# Patient Record
Sex: Female | Born: 1955
Health system: Southern US, Community
[De-identification: ages and names within clinical notes are randomized; demographics above are authoritative.]

## PROBLEM LIST (undated history)

## (undated) DIAGNOSIS — B009 Herpesviral infection, unspecified: Secondary | ICD-10-CM

## (undated) DIAGNOSIS — D649 Anemia, unspecified: Secondary | ICD-10-CM

## (undated) DIAGNOSIS — N852 Hypertrophy of uterus: Secondary | ICD-10-CM

## (undated) DIAGNOSIS — R7309 Other abnormal glucose: Secondary | ICD-10-CM

## (undated) HISTORY — DX: Hypertrophy of uterus: N85.2

## (undated) HISTORY — DX: Herpesviral infection, unspecified: B00.9

## (undated) HISTORY — DX: Anemia, unspecified: D64.9

## (undated) HISTORY — DX: Other abnormal glucose: R73.09

---

## 1978-05-14 HISTORY — PX: TUBAL LIGATION: SHX77

## 1994-05-14 HISTORY — PX: FOOT SURGERY: SHX648

## 1997-11-22 ENCOUNTER — Ambulatory Visit (HOSPITAL_COMMUNITY): Admission: RE | Admit: 1997-11-22 | Discharge: 1997-11-22 | Payer: Self-pay | Admitting: Obstetrics and Gynecology

## 1999-01-04 ENCOUNTER — Other Ambulatory Visit: Admission: RE | Admit: 1999-01-04 | Discharge: 1999-01-04 | Payer: Self-pay | Admitting: *Deleted

## 1999-11-20 ENCOUNTER — Other Ambulatory Visit: Admission: RE | Admit: 1999-11-20 | Discharge: 1999-11-20 | Payer: Self-pay | Admitting: Obstetrics and Gynecology

## 1999-11-20 ENCOUNTER — Encounter (INDEPENDENT_AMBULATORY_CARE_PROVIDER_SITE_OTHER): Payer: Self-pay

## 2000-01-09 ENCOUNTER — Emergency Department (HOSPITAL_COMMUNITY): Admission: EM | Admit: 2000-01-09 | Discharge: 2000-01-09 | Payer: Self-pay | Admitting: Emergency Medicine

## 2000-01-13 HISTORY — PX: VAGINAL HYSTERECTOMY: SUR661

## 2000-01-23 ENCOUNTER — Inpatient Hospital Stay (HOSPITAL_COMMUNITY): Admission: RE | Admit: 2000-01-23 | Discharge: 2000-01-24 | Payer: Self-pay | Admitting: Obstetrics and Gynecology

## 2000-01-23 ENCOUNTER — Encounter (INDEPENDENT_AMBULATORY_CARE_PROVIDER_SITE_OTHER): Payer: Self-pay

## 2001-04-25 ENCOUNTER — Other Ambulatory Visit: Admission: RE | Admit: 2001-04-25 | Discharge: 2001-04-25 | Payer: Self-pay | Admitting: Obstetrics and Gynecology

## 2001-05-09 ENCOUNTER — Ambulatory Visit (HOSPITAL_COMMUNITY): Admission: RE | Admit: 2001-05-09 | Discharge: 2001-05-09 | Payer: Self-pay | Admitting: Obstetrics and Gynecology

## 2001-05-09 ENCOUNTER — Encounter: Payer: Self-pay | Admitting: Obstetrics and Gynecology

## 2003-08-24 ENCOUNTER — Other Ambulatory Visit: Admission: RE | Admit: 2003-08-24 | Discharge: 2003-08-24 | Payer: Self-pay | Admitting: Obstetrics and Gynecology

## 2003-08-24 LAB — HM PAP SMEAR

## 2003-08-27 ENCOUNTER — Ambulatory Visit (HOSPITAL_COMMUNITY): Admission: RE | Admit: 2003-08-27 | Discharge: 2003-08-27 | Payer: Self-pay | Admitting: Obstetrics and Gynecology

## 2004-10-31 ENCOUNTER — Ambulatory Visit: Payer: Self-pay | Admitting: Internal Medicine

## 2004-11-20 ENCOUNTER — Encounter (INDEPENDENT_AMBULATORY_CARE_PROVIDER_SITE_OTHER): Payer: Self-pay | Admitting: Specialist

## 2004-11-20 ENCOUNTER — Ambulatory Visit: Payer: Self-pay | Admitting: Internal Medicine

## 2004-11-21 ENCOUNTER — Ambulatory Visit (HOSPITAL_COMMUNITY): Admission: RE | Admit: 2004-11-21 | Discharge: 2004-11-21 | Payer: Self-pay | Admitting: Obstetrics and Gynecology

## 2004-12-14 ENCOUNTER — Encounter: Admission: RE | Admit: 2004-12-14 | Discharge: 2004-12-14 | Payer: Self-pay | Admitting: Obstetrics and Gynecology

## 2005-11-27 ENCOUNTER — Ambulatory Visit (HOSPITAL_COMMUNITY): Admission: RE | Admit: 2005-11-27 | Discharge: 2005-11-27 | Payer: Self-pay | Admitting: Obstetrics and Gynecology

## 2006-12-03 ENCOUNTER — Ambulatory Visit (HOSPITAL_COMMUNITY): Admission: RE | Admit: 2006-12-03 | Discharge: 2006-12-03 | Payer: Self-pay | Admitting: Obstetrics and Gynecology

## 2007-12-10 ENCOUNTER — Ambulatory Visit (HOSPITAL_COMMUNITY): Admission: RE | Admit: 2007-12-10 | Discharge: 2007-12-10 | Payer: Self-pay | Admitting: Obstetrics and Gynecology

## 2008-09-13 ENCOUNTER — Encounter: Payer: Self-pay | Admitting: Obstetrics and Gynecology

## 2008-09-13 ENCOUNTER — Ambulatory Visit (HOSPITAL_COMMUNITY): Admission: RE | Admit: 2008-09-13 | Discharge: 2008-09-13 | Payer: Self-pay | Admitting: Obstetrics and Gynecology

## 2008-09-13 HISTORY — PX: PELVIC LAPAROSCOPY: SHX162

## 2008-09-13 HISTORY — PX: OTHER SURGICAL HISTORY: SHX169

## 2008-10-21 ENCOUNTER — Ambulatory Visit: Admission: RE | Admit: 2008-10-21 | Discharge: 2008-10-21 | Payer: Self-pay | Admitting: Gynecologic Oncology

## 2008-11-18 ENCOUNTER — Ambulatory Visit: Admission: RE | Admit: 2008-11-18 | Discharge: 2008-11-18 | Payer: Self-pay | Admitting: Gynecologic Oncology

## 2008-12-14 ENCOUNTER — Ambulatory Visit (HOSPITAL_COMMUNITY): Admission: RE | Admit: 2008-12-14 | Discharge: 2008-12-14 | Payer: Self-pay | Admitting: Obstetrics and Gynecology

## 2009-10-25 ENCOUNTER — Encounter (INDEPENDENT_AMBULATORY_CARE_PROVIDER_SITE_OTHER): Payer: Self-pay | Admitting: *Deleted

## 2009-12-15 ENCOUNTER — Ambulatory Visit (HOSPITAL_COMMUNITY): Admission: RE | Admit: 2009-12-15 | Discharge: 2009-12-15 | Payer: Self-pay | Admitting: Obstetrics and Gynecology

## 2010-01-18 ENCOUNTER — Telehealth: Payer: Self-pay | Admitting: Internal Medicine

## 2010-01-19 ENCOUNTER — Encounter: Payer: Self-pay | Admitting: Internal Medicine

## 2010-02-23 ENCOUNTER — Encounter (INDEPENDENT_AMBULATORY_CARE_PROVIDER_SITE_OTHER): Payer: Self-pay | Admitting: *Deleted

## 2010-02-27 ENCOUNTER — Ambulatory Visit: Payer: Self-pay | Admitting: Internal Medicine

## 2010-03-13 ENCOUNTER — Ambulatory Visit: Payer: Self-pay | Admitting: Internal Medicine

## 2010-03-14 LAB — HM COLONOSCOPY

## 2010-04-07 ENCOUNTER — Encounter: Payer: Self-pay | Admitting: Internal Medicine

## 2010-05-11 ENCOUNTER — Ambulatory Visit
Admission: RE | Admit: 2010-05-11 | Discharge: 2010-05-11 | Payer: Self-pay | Source: Home / Self Care | Attending: Surgery | Admitting: Surgery

## 2010-05-25 ENCOUNTER — Encounter: Payer: Self-pay | Admitting: Internal Medicine

## 2010-06-15 NOTE — Progress Notes (Signed)
Summary: ? re recall col  Phone Note Call from Patient Call back at Home Phone [phone]   Caller: Patient Call For: Dr Juanda Chance Reason for Call: Talk to Nurse Summary of Call: Patient was due for recall col in 11-2009 but Dr Juanda Chance changed it to 11-2011. Patient is wondering if she should go ahead and schedule colon since she had her fallopian tubes removed for cancerous spts on them. Initial call taken by: Tawni Levy,  January 18, 2010 12:01 PM  Follow-up for Phone Call        Patient called back and reported that her brother was diagnosed at 83 with colon CA and last year she had her ovaries and fallopian tubes removed for cancer.  Do Brodie her recall was changed.  I will order her paper chart for your review Follow-up by: Darcey Nora RN, CGRN,  January 18, 2010 12:55 PM  Additional Follow-up for Phone Call Additional follow up Details #1::        Dr Juanda Chance I have placed her paper chart in your office Darcey Nora RN, [facility]  January 19, 2010 9:58 AM    Additional Follow-up for Phone Call Additional follow up Details #2::    chart reviewed, she is right. I forgot that her brothe had colon cancer. So, please, schedule colon at her convenience, ( direct) Follow-up by: Hart Carwin MD,  January 19, 2010 12:48 PM  Additional Follow-up for Phone Call Additional follow up Details #3:: Details for Additional Follow-up Action Taken: Patient aware.  Colon scheduled for 03/10/10 11:00.  pre-visit scheduled for 02/27/10 11:00   Additional Follow-up by: Darcey Nora RN, CGRN,  January 19, 2010 1:36 PM

## 2010-06-15 NOTE — Letter (Signed)
Summary: Colonoscopy Date Change Letter  [facility]  [address] [place], [place] [address]   Phone: [phone]  Fax: [phone]      October 25, 2009 MRN: [id number]   [facility] [address] [place], [place]  [address]   Dear [name] [name],   Previously you were recommended to have a repeat colonoscopy around this time. Your chart was recently reviewed by Dr. Hedwig Morton. Juanda Chance of [facility]. Follow up colonoscopy is now recommended in July 2013. This revised recommendation is based on current, nationally recognized guidelines for colorectal cancer screening and polyp surveillance. These guidelines are endorsed by the American Cancer Society, [employer] on Colorectal Cancer as well as numerous other major medical organizations.  Please understand that our recommendation assumes that you do not have any new symptoms such as bleeding, a change in bowel habits, anemia, or significant abdominal discomfort. If you do have any concerning GI symptoms or want to discuss the guideline recommendations, please call to arrange an office visit at your earliest convenience. Otherwise we will keep you in our reminder system and contact you 1-2 months prior to the date listed above to schedule your next colonoscopy.  Thank you,  Hedwig Morton. Juanda Chance, M.D.  [facility] Gastroenterology Division [phone]

## 2010-06-15 NOTE — Letter (Signed)
Summary: Pre Visit Letter Revised  [facility]  [address] [place], [place] [address]   Phone: [phone]  Fax: [phone]        01/19/2010 MRN: [id number] [facility] [address] [place], [place]  [address]             Procedure Date:  03/10/10  Welcome to the Gastroenterology Division at [facility].    You are scheduled to see a nurse for your pre-procedure visit on 02/27/10 at 11:00 on the 3rd floor at [facility], [address]  We ask that you try to arrive at our office 15 minutes prior to your appointment time to allow for check-in.  Please take a minute to review the attached form.  If you answer "Yes" to one or more of the questions on the first page, we ask that you call the person listed at your earliest opportunity.  If you answer "No" to all of the questions, please complete the rest of the form and bring it to your appointment.    Your nurse visit will consist of discussing your medical and surgical history, your immediate family medical history, and your medications.   If you are unable to list all of your medications on the form, please bring the medication bottles to your appointment and we will list them.  We will need to be aware of both prescribed and over the counter drugs.  We will need to know exact dosage information as well.    Please be prepared to read and sign documents such as consent forms, a financial agreement, and acknowledgement forms.  If necessary, and with your consent, a friend or relative is welcome to sit-in on the nurse visit with you.  Please bring your insurance card so that we may make a copy of it.  If your insurance requires a referral to see a specialist, please bring your referral form from your primary care physician.  No co-pay is required for this nurse visit.     If you cannot keep your appointment, please call [phone] to cancel or reschedule prior to your appointment date.  This allows  [place] the opportunity to schedule an appointment for another patient in need of care.    Thank you for choosing [facility] for your medical needs.  We appreciate the opportunity to care for you.  Please visit [place] at our website  to learn more about our practice.  Sincerely, The Gastroenterology Division

## 2010-06-15 NOTE — Letter (Signed)
Summary: [facility] Instructions  [facility]  [address] [place], [place] [address]   Phone: [phone]  Fax: [phone]       [facility]    [birth year]    MRN: [id number]       Procedure Day /Date:  Monday 03/13/2010     Arrival Time: 8:00 am     Procedure Time: 9:00 am     Location of Procedure:                    _x _  [facility] (4th Floor)    PREPARATION FOR COLONOSCOPY WITH MIRALAX  Starting 5 days prior to your procedure Wednesday 10/26 do not eat nuts, seeds, popcorn, corn, beans, peas,  salads, or any raw vegetables.  Do not take any fiber supplements (e.g. Metamucil, Citrucel, and Benefiber). ____________________________________________________________________________________________________   THE DAY BEFORE YOUR PROCEDURE         DATE: Sunday 10/30  1   Drink clear liquids the entire day-NO SOLID FOOD  2   Do not drink anything colored red or purple.  Avoid juices with pulp.  No orange juice.  3   Drink at least 64 oz. (8 glasses) of fluid/clear liquids during the day to prevent dehydration and help the prep work efficiently.  CLEAR LIQUIDS INCLUDE: Water Jello Ice Popsicles Tea (sugar ok, no milk/cream) Powdered fruit flavored drinks Coffee (sugar ok, no milk/cream) Gatorade Juice: apple, white grape, white cranberry  Lemonade Clear bullion, consomm, broth Carbonated beverages (any kind) Strained chicken noodle soup Hard Candy  4   Mix the entire bottle of Miralax with 64 oz. of Gatorade/Powerade in the morning and put in the refrigerator to chill.  5   At 3:00 pm take 2 Dulcolax/Bisacodyl tablets.  6   At 4:30 pm take one Reglan/Metoclopramide tablet.  7  Starting at 5:00 pm drink one 8 oz glass of the Miralax mixture every 15-20 minutes until you have finished drinking the entire 64 oz.  You should finish drinking prep around 7:30 or 8:00 pm.  8   If you are nauseated, you may take the 2nd Reglan/Metoclopramide  tablet at 6:30 pm.        9    At 8:00 pm take 2 more DULCOLAX/Bisacodyl tablets.     THE DAY OF YOUR PROCEDURE      DATE:  Monday 10/31  You may drink clear liquids until 7:00 am  (2 HOURS BEFORE PROCEDURE).   MEDICATION INSTRUCTIONS  Unless otherwise instructed, you should take regular prescription medications with a small sip of water as early as possible the morning of your procedure.          OTHER INSTRUCTIONS  You will need a responsible adult at least 56 years of age to accompany you and drive you home.   This person must remain in the waiting room during your procedure.  Wear loose fitting clothing that is easily removed.  Leave jewelry and other valuables at home.  However, you may wish to bring a book to read or an iPod/MP3 player to listen to music as you wait for your procedure to start.  Remove all body piercing jewelry and leave at home.  Total time from sign-in until discharge is approximately 2-3 hours.  You should go home directly after your procedure and rest.  You can resume normal activities the day after your procedure.  The day of your procedure you should not:   Drive  Make legal decisions   Operate machinery   Drink alcohol   Return to work  You will receive specific instructions about eating, activities and medications before you leave.   The above instructions have been reviewed and explained to me by   Ezra Sites RN  February 27, 2010 11:04 AM    I fully understand and can verbalize these instructions _____________________________ Date _______

## 2010-06-15 NOTE — Miscellaneous (Signed)
Summary: [facility]  Clinical Lists Changes  Medications: Added new medication of MIRALAX   POWD (POLYETHYLENE GLYCOL 3350) As per prep  instructions. - Signed Added new medication of DULCOLAX 5 MG  TBEC (BISACODYL) Day before procedure take 2 at 3pm and 2 at 8pm. - Signed Added new medication of REGLAN 10 MG  TABS (METOCLOPRAMIDE HCL) As per prep instructions. - Signed Rx of MIRALAX   POWD (POLYETHYLENE GLYCOL 3350) As per prep  instructions.;  #255gm x 0;  Signed;  Entered by: Ezra Sites RN;  Authorized by: Hart Carwin MD;  Method used: [facility] [phone]*, [address] [place], [place]  [address], Ph: [phone] or [phone], Fax: [phone] Rx of DULCOLAX 5 MG  TBEC (BISACODYL) Day before procedure take 2 at 3pm and 2 at 8pm.;  #4 x 0;  Signed;  Entered by: Ezra Sites RN;  Authorized by: Hart Carwin MD;  Method used: [facility] to [facility] [phone]*, [address] [place], [place]  [address], Ph: [phone] or [phone], Fax: [phone] Rx of REGLAN 10 MG  TABS (METOCLOPRAMIDE HCL) As per prep instructions.;  #2 x 0;  Signed;  Entered by: Ezra Sites RN;  Authorized by: Hart Carwin MD;  Method used: [facility] to [facility] [phone]*, [address] [place], [place]  [address], Ph: [phone] or [phone], Fax: [phone] Allergies: Added new allergy or adverse reaction of CODEINE Observations: Added new observation of NKA: F (02/27/2010 10:38)    Prescriptions: REGLAN 10 MG  TABS (METOCLOPRAMIDE HCL) As per prep instructions.  #2 x 0   Entered by:   Ezra Sites RN   Authorized by:   Hart Carwin MD   Signed by:   Ezra Sites RN on 02/27/2010   Method used:   [facility]ronically to        [facility] [phone]* (retail)       [address]       [place], [place]  [address]       Ph: [phone] or [phone]       Fax: [phone]   RxID:   [id number] DULCOLAX 5 MG  TBEC (BISACODYL) Day before procedure take 2 at 3pm and 2 at 8pm.  #4 x 0  Entered by:   Ezra Sites RN   Authorized by:   Hart Carwin MD   Signed by:   Ezra Sites RN on 02/27/2010   Method used:   Electronically to        [facility] [phone]* (retail)       [address]       [place], [place]  [address]       Ph: [phone] or [phone]       Fax: [phone]   RxID:   [id number] MIRALAX   POWD (POLYETHYLENE GLYCOL 3350) As per prep  instructions.  #255gm x 0   Entered by:   Ezra Sites RN   Authorized by:   Hart Carwin MD   Signed by:   Ezra Sites RN on 02/27/2010   Method used:   Electronically to        [facility] [phone]* (retail)       [address]       [place], [place]  [address]       Ph: [phone] or [phone]       Fax: [phone]   RxID:   [id number]

## 2010-06-15 NOTE — Procedures (Signed)
Summary: Colonoscopy  Patient: [name] Note: All result statuses are Final unless otherwise noted.  Tests: (1) Colonoscopy (COL)   COL Colonoscopy           DONE (C)      [facility]     [address]     [place], [place]  [address]           COLONOSCOPY PROCEDURE REPORT           PATIENT:  [name] [name], [name] [name]  MR#:  [id number]     BIRTHDATE:  January 17, 1956, 54 yrs. old  GENDER:  female     ENDOSCOPIST:  Hedwig Morton. Juanda Chance, MD     REF. BY:  Meredeth Ide, M.D., Dr Maurice Small     PROCEDURE DATE:  03/13/2010     PROCEDURE:  Colonoscopy [address]     ASA CLASS:  Class I     INDICATIONS:  history of hyperplastic polyps     MEDICATIONS:   Versed 5 mg, Fentanyl 50 mcg           DESCRIPTION OF PROCEDURE:   After the risks benefits and     alternatives of the procedure were thoroughly explained, informed     consent was obtained.  Digital rectal exam was performed and     revealed no rectal masses.   The LB [id number]-[id number] endoscope     was introduced through the anus and advanced to the cecum, which     was identified by both the appendix and ileocecal valve, without     limitations.  The quality of the prep was excellent, using     MiraLax.  The instrument was then slowly withdrawn as the colon     was fully examined.     <<PROCEDUREIMAGES>>           FINDINGS:  Internal and external hemorrhoids were found (see     image4, image5, and image6).  This was otherwise a normal     examination of the colon (see image3, image2, and image1).     Retroflexed views in the rectum revealed no abnormalities.    The     scope was then withdrawn from the patient and the procedure     completed.           COMPLICATIONS:  None     ENDOSCOPIC IMPRESSION:     1) Internal and external hemorrhoids     2) Otherwise normal examination     RECOMMENDATIONS:     1) high fiber diet     Analpram 2.5% cream prn, #15 gm, apply bid     REPEAT EXAM:  In 10 year(s) for.        ______________________________     Hedwig Morton. Juanda Chance, MD           CC:           n.     REVISED:  03/13/2010 09:43 AM     eSIGNED:   Hedwig Morton. Brodie at 03/13/2010 09:43 AM           Pabst, Adysen, Raphael [id number]  Note: An exclamation mark (!) indicates a result that was not dispersed into the flowsheet. Document Creation Date: 03/13/2010 9:44 AM _______________________________________________________________________  (1) Order result status: Final Collection or observation date-time: 03/13/2010 09:35 Requested date-time:  Receipt date-time:  Reported date-time:  Referring Physician:   Ordering Physician: Lina Sar [phone]) Specimen Source:  Source: [name] Order Number: [id number] Lab site:   Appended  Document: Colonoscopy    Clinical Lists Changes  Observations: Added new observation of COLONNXTDUE: 02/2020 (03/13/2010 13:37)

## 2010-06-15 NOTE — Letter (Signed)
Summary: [facility]   Imported By: Lester Litchfield 04/25/2010 08:53:44  _____________________________________________________________________  External Attachment:    Type:   Image     Comment:   External Document

## 2010-06-15 NOTE — Miscellaneous (Signed)
Summary: analpram rx.  Clinical Lists Changes  Medications: Added new medication of ANALPRAM-HC 1-2.5 %  CREA (HYDROCORTISONE ACE-PRAMOXINE) analpram 2.5% cream as needed, #15gm, apply two times a day. - Signed Rx of ANALPRAM-HC 1-2.5 %  CREA (HYDROCORTISONE ACE-PRAMOXINE) analpram 2.5% cream as needed, #15gm, apply two times a day.;  #1 x 0;  Signed;  Entered by: Darlyn Read RN;  Authorized by: Hart Carwin MD;  Method used: [facility] to [facility] [phone]*, [address] [place], [place]  [address], Ph: [phone] or [phone], Fax: [phone]    Prescriptions: ANALPRAM-HC 1-2.5 %  CREA (HYDROCORTISONE ACE-PRAMOXINE) analpram 2.5% cream as needed, #15gm, apply two times a day.  #1 x 0   Entered by:   Darlyn Read RN   Authorized by:   Hart Carwin MD   Signed by:   Darlyn Read RN on 03/13/2010   Method used:   Electronically to        [facility] [phone]* (retail)       [address]       [place], [place]  [address]       Ph: [phone] or [phone]       Fax: [phone]   RxID:   [id number]

## 2010-06-21 NOTE — Letter (Signed)
Summary: Valarie Merino MD/[facility]  Valarie Merino MD/[facility]   Imported By: Lester Corunna 06/15/2010 10:59:02  _____________________________________________________________________  External Attachment:    Type:   Image     Comment:   External Document

## 2010-07-24 LAB — POCT HEMOGLOBIN-HEMACUE: Hemoglobin: 14 g/dL (ref 12.0–15.0)

## 2010-08-22 LAB — CBC
MCHC: 33.6 g/dL (ref 30.0–36.0)
WBC: 5.2 10*3/uL (ref 4.0–10.5)

## 2010-09-26 NOTE — Consult Note (Signed)
NAME:  [name] [name]              ACCOUNT NO.:  [id number]   MEDICAL RECORD NO.:  [id number]          PATIENT TYPE:  OUT   LOCATION:                               FACILITY:  [facility]   PHYSICIAN:  Laurette Schimke, MD     DATE OF BIRTH:  1955-06-19   DATE OF CONSULTATION:  DATE OF DISCHARGE:                                 CONSULTATION   REASON FOR VISIT:  Discussion of pathology review.   HISTORY OF PRESENT ILLNESS:  This is a 55 year old gravida 3, para 3,  who underwent hysterectomy for menorrhagia in 2001.  She developed  dyspareunia subsequently, and as such, underwent a laparoscopic BSO on  Sep 13, 2008.  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  A review was obtained from Dr. Serita Butcher at [facility] and the findings were notable for an  area of serous adenocarcinoma in situ.  It was unclear whether or not  this was an area of in situ neoplasia of the tube or endometrioid.  There is also a different focus, which is subtly hinting to atrophic  endometriosis in the ovaries.  These findings were discussed with [name] [name] and in addition, a query was made regarding her family history.  I found that she has a mother with bladder cancer diagnosed at the age  of 59, a sister with leukemia diagnosed at 27, a brother with prostate  cancer at age of 57, and a sister with colon cancer at the age of 69.  There are no known female or female breast cancers, nor is there any  family history of ovarian cancer.  I have informed [name] [name] that it is  my opinion that she can return to just normal surveillance with her  normal general examinations as required for excellent female health  care.  She was happy with this information and a copy of that pathology  summary was provided to her for her medical record.  I have asked [name] [name] to follow up with Dr. Tresa Res.      Laurette Schimke, MD  Electronically Signed     WB/MEDQ  D:  11/18/2008  T:   11/18/2008  Job:  [id number]   cc:   Telford Nab, R.N.  [address]  [place], [place] [address]   Edwena Felty. Romine, M.D.  Fax: [phone]

## 2010-09-26 NOTE — Op Note (Signed)
NAME:  [name] [name], [name] [name]              ACCOUNT NO.:  [id number]   MEDICAL RECORD NO.:  [id number]          PATIENT TYPE:  AMB   LOCATION:  [facility]                           FACILITY:  [facility]   PHYSICIAN:  Cynthia P. Romine, M.D.DATE OF BIRTH:  28-Jan-1956   DATE OF PROCEDURE:  09/13/2008  DATE OF DISCHARGE:                               OPERATIVE REPORT   PREOPERATIVE DIAGNOSIS:  Dyspareunia, status post total vaginal  hysterectomy.   POSTOPERATIVE DIAGNOSIS:  Dyspareunia, status post total vaginal  hysterectomy; endometriosis; and multiple adhesions to the vaginal cuff.   PROCEDURES:  Operative laparoscopy with lysis of adhesions and bilateral  salpingo-oophorectomy.   SURGEON:  Cynthia P. Romine, MD   ASSISTANT:  Lum Keas, MD   ANESTHESIA:  General endotracheal.   ESTIMATED BLOOD LOSS:  Minimal.   COMPLICATIONS:  None.   FINDINGS:  Upon entering the abdomen with the laparoscope, it could be  seen that there was some filmy adhesions between the small bowel and the  vaginal cuff and between the ovary on the patient's right and the  vaginal cuff.  There was an approximately 1-cm cystic-looking structure  just towards the round ligaments in the ovary that appeared to be  endometriosis on the patient's right.  The left ovary was more mobile,  but there were adhesions of the colon to the pelvic sidewall and the  cuff on that side as well.   PROCEDURE:  The patient was taken to the operating room and after  induction of adequate general endotracheal anesthesia, was placed in the  low dorsal lithotomy position and prepped and draped in the usual  fashion.  As the patient was being prepped with Betadine, it was noted  that small bowel was visible on her abdomen that then spread to the  lower part of her breasts and down her thighs.  The possible ascending  agent could be the CDG wash that the patient did preoperatively in the  holding room as per hospital policy to prevent MRSA or  she had been  given 2 g of cefotetan in the holding room as well.  She had no previous  allergic reaction to antibiotics.  She was given 50 mg of Benadryl and  125 mg of Solu-Medrol IV and the wells did decrease although not go  completely absent at the end of the procedure.  After prepping and  draping and inserting a Foley catheter, a sponge stick was inserted into  the vagina.  The site of her previous tubal sterilization procedure just  lower umbilicus was infiltrated with 0.25% Marcaine and then incised  with a knife.  A Veress needle was inserted into the peritoneal space.  The CO2 insufflator was attached to the Veress needle and proper  placement was noted by noting the decline in the pressure when the  Veress needle entered the peritoneal space.  Pneumoperitoneum was  created with 2.5 L of CO2 using automatic insufflator.  A disposable  bladed 10/11-mm trocar was then inserted into the peritoneal space and  its proper placement noted with the microscope.  Two 5-mm trocars were  then inserted under direct visualization in the right and left lower  quadrants having infiltrated the skin with 0.25% Marcaine prior to  incising it with the knife.  A blunt probe and an atraumatic grasper  were placed into the pelvis and the pelvis was inspected with the above  findings.  The adhesions were taken down sharply with cold scissors.  These were the ones that were between the bowel and the vaginal cuff and  the pelvic sidewall.  The ureters were identified bilaterally and EnSeal  device was used to coagulate and cut the infundibulopelvic ligaments on  either side and the round ligaments and the ovaries and tubes were  detached.  No bleeding was encountered.  The ureters were again seen  peristalsing bilaterally after detaching the tubes and ovaries.  These  were placed in the cul-de-sac.  The 5-mm scope was placed into the port  in the right lower quadrant.  A grasper with teeth was placed  through  the 10/11-mm port in the umbilicus and each specimen was brought out  through the umbilicus sequentially.  The 10-mm scope was then reinserted  through the umbilical port and Nezhat irrigator/aspirator was used to  irrigate the pelvis.  It was found to be hemostatic.  Interceed had been  placed to the 10-mm port and was brought out over the vaginal cuff and  the surrounding areas of raw peritoneum.  Photographic documentation was  taken.  The instruments and the trocar sleeves were removed under direct  visualization.  Pneumoperitoneum was allowed to escape.  The umbilical  trocar was removed.  The fascia at the umbilicus was grasped with  Kocher's and closed with a single suture of 0 Vicryl.  The skin was  closed at the umbilicus with 4-0 Vicryl Rapide and in the left lower  quadrant, the small incision was bleeding and therefore it was closed  subcuticularly with 4-0 Vicryl Rapide as well.  Dermabond was placed  over all the incisions.  The instruments were removed from the vagina.  The Foley catheter was removed and the procedure was terminated.  The  patient tolerated it well and was transported in stable condition to  postanesthesia recovery.      Cynthia P. Romine, M.D.  Electronically Signed     CPR/MEDQ  D:  09/13/2008  T:  09/13/2008  Job:  [id number]

## 2010-09-26 NOTE — Consult Note (Signed)
NAME:  [name], [name]              ACCOUNT NO.:  [id number]   MEDICAL RECORD NO.:  [id number]          PATIENT TYPE:  OUT   LOCATION:  GYN                          FACILITY:  [facility]   PHYSICIAN:  Laurette Schimke, MD     DATE OF BIRTH:  1956/01/08   DATE OF CONSULTATION:  10/21/2008  DATE OF DISCHARGE:  10/21/2008                                 CONSULTATION   REASON FOR VISIT:  [name] was seen in consultation by Dr. Tresa Res for  tubal adenocarcinoma in situ.   HISTORY OF PRESENT ILLNESS:  This is a 55 year old gravida 3, para 3 who  underwent a hysterectomy for menorrhagia in 2001.  She developed  subsequent dyspareunia, and a diagnostic laparoscopy was performed on  Sep 13, 2008.  A lesion was noted on the adnexa, and she underwent a  bilateral salpingo-oophorectomy.  Final pathology was notable for small  foci of atypical glandular epithelium involving fallopian tissue.  It is  unclear the laterality of the dysplasia.  The ovaries, however, were  notably within normal limits.  [name] denies any nausea, vomiting,  abdominal pain.  She reports no early satiety or changes in bowel or  rectal habits, bloating, shortness of breath, or chest pain.   PAST MEDICAL HISTORY:  None.   PAST SURGICAL HISTORY:  1. Total laparoscopic hysterectomy in 2001.  2. Bilateral salpingo-oophorectomy in 2001.   PAST GYN HISTORY:  Menarche before the age of 29.  Menses monthly until  2001.  Reports the use of oral contraceptive pills for 1 year, duration  at the age of 3 and IUD for 2 years between her pregnancies.   FAMILY HISTORY:  Notable for mother with bladder cancer diagnosed at the  age of 58.  A sister with leukemia, diagnosed at the age of 33.  A  brother with prostate cancer, diagnosed at the age of 69.  A sister with  colon cancer, diagnosed at the age of 80.   SCREENING HISTORY:  Mammogram in July, 2009.  Colonoscopy in 2006.   SOCIAL HISTORY:  She is a Location manager.  She denies  any tobacco or  alcohol use.  She is divorced.  Her children are alive and well.   REVIEW OF SYSTEMS:  A 10-point review of systems is notable for the  items discussed in the history of present illness.   PHYSICAL EXAMINATION:  Weight 5 feet 3-1/2 inches.  Weight is 145  pounds.  Blood pressure 110/80.  ABDOMEN:  Soft and nontender.  Laparoscopy sites are well healed.  There  are no palpable masses, no enlarged liver.  LYMPH NODES:  No cervical, inguinal, or axillary adenopathy.   IMPRESSION:  Fallopian tube atypia with features compatible with tubular  glandular dysplasia, in situ carcinoma.  This is a very difficult  diagnosis to make.  I am unclear if serial sectionings of the adnexa  were performed.  I have asked that the specimens be reviewed at the  [facility] Pathology Department.  [name] will follow up in 2  months to discuss the findings.  Laurette Schimke, MD  Electronically Signed     WB/MEDQ  D:  10/21/2008  T:  10/22/2008  Job:  [id number]   cc:   Edwena Felty. Romine, M.D.  Fax: [phone]   Telford Nab, R.N.  [address]  [place], [place] [address]

## 2010-09-29 NOTE — H&P (Signed)
[facility]  Patient:    [name] [name], [name] [name]                       MRN: [id number] Adm. Date:  [id number] Attending:  Jenean Lindau                         History and Physical  BRIEF HISTORY:  [name] [name] is a 55 year old female with leiomyomata uteri and likely adenomyosis admitted for vaginal hysterectomy.  HISTORY OF PRESENT ILLNESS:  [name] [name] is a 55 year old gravida 3, para 3, divorced female status post tubal ligation who has been followed by this physician for the past 8 years.  During that time she has had a gradual enlargement of fibroid uterus.  She has also had gradual worsening of menometrorrhagia and dysmenorrhea and has been anemic with a hemoglobin as low as 9.  She has been tried on multiple birth control pills with marginal improvement in her symptoms and intolerable side effects.  She underwent endometrial sampling which revealed benign disorder proliferative endometrium. Pelvis ultrasound confirmed the presence of two demonstrable fibroids with diffuse changes in the myometrium consistent with diffuse fibroids versus adenomyosis.  There were no focal lesions on amenable to resection.  She was offered alternatives of conservative therapy including retry of birth control pills versus progesterone only pills versus endometrial resection or ablation versus hysterectomy.  She opted for definitive surgical management.  She has seen the informed consent film.  She has been extensively counseled as to the risks, benefits, and alternatives and complications and agrees to proceed. She desires ovarian conservation of the ovaries appear normal.  She understands she will be permanently and irreversibly sterilized as a result.  PAST MEDICAL HISTORY:  MEDICAL:  Anemia presumed due to chronic blood loss.  SURGICAL:  In 1980 tubal ligation.  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  OBSTETRICAL:   Vaginal delivery x 3 without complications.  Largest infant greater than 9 pounds.  GYNECOLOGIC HISTORY:  As noted above.  Last Pap smear in august of 2000, within normal limits.  Mammogram is currently due.  Last mammogram in July of 1999, within normal limits.  ALLERGIES:  No known drug allergies.  CURRENT MEDICATIONS: 1. Geritol once daily. 2. Iron supplementation once daily.  TRANSFUSION HISTORY:  Negative.  FAMILY HISTORY:  Positive for cervical carcinoma in her mother.  Positive for colon cancer in her brother diagnosed last year at the age of 55.  She also has 2 sisters with Behcets syndrome.  SOCIAL HISTORY:  The patient is divorced.  She has 3 grown children.  She works at [employer].  she denies any smoking, alcohol, or illicit drug use.  REVIEW OF SYSTEMS:  Is notable for the history of present illness.  Cardio- vascular/respiratory:  Negative.  Neurologic:  Negative.  GI:  Negative.  GU: As noted above.  No complaints of urine leakage or symptoms of prolapse.  PHYSICAL EXAMINATION:  GENERAL:  Physical examination performed prior to admission revealed a healthy appearing female, in no acute distress.  VITAL SIGNS:  Blood pressure 110/72, pulse 68, respirations 18, temperature 98.2.  Height 5 feet 4 inches.  Weight 151 pounds.  HEENT:  Grossly negative.  Oropharynx clear.  NECK:  Supple without thyromegaly or lymphadenopathy.  CHEST:  Without or scars or CVA tenderness.  LUNGS:  Clear to auscultation.  HEART:  Regular rate  and rhythm without murmur, gallop, or rubs.  CAROTIDS:  Plus 2 and equal without bruits.  BREASTS:  Without masses.  ABDOMEN:  Soft, nontender without hepatosplenomegaly.  PELVIC EXAM:  Revealed a 10-12 week size irregular uterus which has not changed.  No obvious adnexal masses.  Moderate cervical uterine descent noted.  RECTOVAGINAL:  Confirmatory.  EXTREMITIES:  Without edema.  NEUROLOGIC:  Grossly nonfocal.  LABORATORY  DATA:  On admission revealed a hemoglobin of 12.1, hematocrit 37.0, white count 4.2, normal platelets 362, normal metabolic profile.  Normal coagulation studies.  Negative urinalysis.  Chest x-ray and EKG were deferred per anesthesia.  IMPRESSION ON ADMISSION:  Leiomyoma uteri with likely adenomyosis with worsening menorrhagia, dysmenorrhea, and a history of anemia, unresponsive to medical management.  PLAN:  The patient is admitted for same day surgery.  She will undergo a vaginal hysterectomy.  She will have ovarian conservation unless there is indication for ovarian removal.  Full consent has been given.  She will receive Ancef 1 gram IV antibiotic prophylaxis preoperatively. DD:  01/23/00 TD:  01/23/00 Job: [id number]

## 2010-09-29 NOTE — Op Note (Signed)
[facility]  Patient:    [name] [name], [name] [name]                       MRN: [id number] Proc. Date: 01/23/00 Adm. Date:  [id number] Attending:  Jenean Lindau                           Operative Report  PREOPERATIVE DIAGNOSES: 1. Leiomyomata uteri. 2. Adenomyosis. 3. Menometrorrhagia.  POSTOPERATIVE DIAGNOSES: 1. Leiomyomata uteri. 2. Adenomyosis. 3. Menometrorrhagia.  PROCEDURE:  Transvaginal hysterectomy.  SURGEON:  Laqueta Linden, M.D.  ASSISTANT:  Andres Ege, M.D.  ANESTHESIA:  General endotracheal.  ESTIMATED BLOOD LOSS:  150 cc.  FLUIDS:  1200 cc of crystalloid.  URINE OUTPUT:  45 cc catheterized at the conclusion of the procedure.  COUNTS:  Correct x 2.  COMPLICATIONS:  None.  INDICATIONS FOR PROCEDURE:  [name] [name] is a 55 year old gravida 3, para 3 female status post tubal ligation who has had an ongoing history of worsening menorrhagia and dysmenorrhea. She tried several different low dose birth control pills with marginal results. She has been anemic with a hemoglobin as low as 9 in the past 6 years. She has had a moderately enlarged uterus to approximately 9-10 week size felt to be consistent with multiple small fibroids. She complained of worsening side effects on birth control pills and eventually stopped these. Her uterus was felt to gradually enlarge over the ensuing 2 years. She underwent a pelvic ultrasound most recently in April of this year which revealed an enlarged uterus with 2 measurable fibroids measuring 4.2 x 4. 2 cm and 2.8 x 1.8 cm with irregularities in the myometrium consistent with adenomyosis and/or diffuse fibroid change. She underwent a sonohysterogram which revealed no intrauterine lesions that were amendable to hysteroscopic resection. Endometrial sampling revealed a sort of proliferative endometrium with focal degenerative changes. She was given options of resuming low dose pills which she  declined as they did not work in the first place. Other options including an attempt at endometrial resection versus oblation versus vaginal hysterectomy were discussed. She elected to proceed to definitive surgical management. She is admitted now for vaginal hysterectomy. She desires ovarian conservation if they are normal. She has been extensively counseled as to the risks, benefits, alternatives and complications and agrees to proceed.  DESCRIPTION OF PROCEDURE:  The patient was taken to the operating room and after proper identification and consents were ascertained, she was placed on the operating table in supine position. After the induction of general endotracheal anesthesia, she was placed in the candy cane stirrups and the perineum and vagina were prepped and draped in a routine sterile fashion. The bladder was emptied with a red rubber catheter. A weighted speculum was placed in the posterior vagina. The cervix was grasped with a single tooth tenaculum and advanced almost to the introitus. The portio was injected with a 1:100,000 solution of epinephrine circumferentially. The cervix was then circumscribed with a scalpel. The vaginal mucosa was then bluntly advanced off the cervix using a finger and a Raytec sponge. The posterior vaginal mucosa was then tinted down with the pickups and curved Mayo scissors were then used to sharply enter the cul-de-sac. The long weighted speculum was then placed. The uterosacral ligaments were clamped, cut and Heaney ligated and tagged bilaterally. The cardinal ligaments were similarly clamped, cut, suture ligated and tagged. At this point, the anterior peritoneal reflection was identified and entered  sharply without obvious injury or entry into the bladder. The uterine vessels were then clamped bilaterally  incorporating both the anterior and posterior peritoneum. There were noted to be a proliferation of the vessels consistent with the patients  enlarged uterus. Two or three pedicles on each side were taken to incorporate all of the vessels with pedicles cut and suture ligated. The uterus was then flipped posteriorly. There was noted to be a 4-5 cm fibroid distorting the right cornual region. Clamps were placed across the proximal adnexal pedicles bilaterally incorporating proximal utero-ovarian ligament, fallopian tube and round ligament with excision of the specimen. The pedicles were triply ligated with 2 free ties and a stitch of #0 Vicryl. There was a moderate amount of bleeding on the right adnexal pedicle which was identified to be coming from a portion of proximal fallopian tube which had escaped from the ligature. This was separately clamped and ligated and then the entire pedicle was religated. This was observed for a period of several minutes and hemostasis was noted to be excellent. Both the tube and ovaries appeared completely normal. There was no evidence of a cyst that had been noted several months ago on ultrasound. There is no evidence of endometriosis. After adequate hemostasis was ascertained, a McCall suture was placed through the vaginal mucosa through both uterosacral ligaments with closure of the cul-de-sac peritoneum to prevent enterocele formation. This suture was tied at the conclusion of the procedure. The parietoperitoneum was closed in a pursestring fashion with 2-0 Vicryl suture. Counts were correct prior to closure of the peritoneum. After excellent hemostasis at both adnexal pedicles was again documented, these pedicles were then released into the peritoneal cavity prior to closure of the peritoneum. The posterior vaginal cuff had been reefed to the posterior peritoneum with improvement in hemostasis from this area as well. At this point, hemostasis was felt to be excellent with the exception of a slight amount of oozing from the anterior cuff. The cuff was closed from side-to-side using  interrupted figure-of-eight sutures of #0 Vicryl. Hemostasis was noted to be excellent. Vaginal support was noted to be excellent. The McCall suture was tied. The  bladder was emptied of 45 cc of clear urine, none had accumulated in the approximately 45 minutes of the procedure. The vaginal cuff was dry. No packing was left in place. The patient was awakened and stable on transfer to the recovery room. Estimated blood loss 150 cc. Counts correct x 2. Complications none. DD:  01/23/00 TD:  01/24/00 Job: [id number]

## 2010-11-28 ENCOUNTER — Other Ambulatory Visit: Payer: Self-pay | Admitting: Obstetrics and Gynecology

## 2010-11-28 DIAGNOSIS — Z1231 Encounter for screening mammogram for malignant neoplasm of breast: Secondary | ICD-10-CM

## 2010-12-18 ENCOUNTER — Ambulatory Visit (HOSPITAL_COMMUNITY)
Admission: RE | Admit: 2010-12-18 | Discharge: 2010-12-18 | Disposition: A | Discharge status: Home / Self Care | Payer: 59 | Source: Ambulatory Visit | Attending: Obstetrics and Gynecology | Admitting: Obstetrics and Gynecology

## 2010-12-18 DIAGNOSIS — Z1231 Encounter for screening mammogram for malignant neoplasm of breast: Secondary | ICD-10-CM

## 2011-11-14 ENCOUNTER — Other Ambulatory Visit: Payer: Self-pay | Admitting: Obstetrics and Gynecology

## 2011-11-14 DIAGNOSIS — Z1231 Encounter for screening mammogram for malignant neoplasm of breast: Secondary | ICD-10-CM

## 2011-12-20 ENCOUNTER — Ambulatory Visit (HOSPITAL_COMMUNITY)
Admission: RE | Admit: 2011-12-20 | Discharge: 2011-12-20 | Disposition: A | Discharge status: Home / Self Care | Payer: 59 | Source: Ambulatory Visit | Attending: Obstetrics and Gynecology | Admitting: Obstetrics and Gynecology

## 2011-12-20 DIAGNOSIS — Z1231 Encounter for screening mammogram for malignant neoplasm of breast: Secondary | ICD-10-CM

## 2012-05-14 ENCOUNTER — Other Ambulatory Visit: Payer: Self-pay | Admitting: Physician Assistant

## 2012-05-14 DIAGNOSIS — B009 Herpesviral infection, unspecified: Secondary | ICD-10-CM

## 2012-08-30 ENCOUNTER — Ambulatory Visit (INDEPENDENT_AMBULATORY_CARE_PROVIDER_SITE_OTHER): Payer: 59 | Admitting: Family Medicine

## 2012-08-30 ENCOUNTER — Ambulatory Visit: Payer: 59

## 2012-08-30 VITALS — BP 144/71 | HR 61 | Temp 97.9°F | Resp 16 | Ht 62.0 in | Wt 161.2 lb

## 2012-08-30 DIAGNOSIS — R300 Dysuria: Secondary | ICD-10-CM

## 2012-08-30 DIAGNOSIS — B009 Herpesviral infection, unspecified: Secondary | ICD-10-CM

## 2012-08-30 DIAGNOSIS — M25561 Pain in right knee: Secondary | ICD-10-CM

## 2012-08-30 DIAGNOSIS — M79609 Pain in unspecified limb: Secondary | ICD-10-CM

## 2012-08-30 DIAGNOSIS — R21 Rash and other nonspecific skin eruption: Secondary | ICD-10-CM

## 2012-08-30 DIAGNOSIS — N390 Urinary tract infection, site not specified: Secondary | ICD-10-CM

## 2012-08-30 DIAGNOSIS — R81 Glycosuria: Secondary | ICD-10-CM

## 2012-08-30 DIAGNOSIS — Z131 Encounter for screening for diabetes mellitus: Secondary | ICD-10-CM

## 2012-08-30 LAB — GLUCOSE, POCT (MANUAL RESULT ENTRY): POC Glucose: 93 mg/dl (ref 70–99)

## 2012-08-30 MED ORDER — CLOTRIMAZOLE-BETAMETHASONE 1-0.05 % EX CREA: TOPICAL_CREAM | Freq: Two times a day (BID) | CUTANEOUS | Status: DC

## 2012-08-30 MED ORDER — VALACYCLOVIR HCL 1 G PO TABS: 500.0000 mg | ORAL_TABLET | Freq: Every day | ORAL | Status: DC

## 2012-08-30 MED ORDER — PREDNISONE 20 MG PO TABS: ORAL_TABLET | ORAL | Status: DC

## 2012-08-30 NOTE — Progress Notes (Signed)
  Subjective:    [name], female    DOB: 06/11/1955, 57 y.o.   MRN: [id number]  HPI 57 year old female presents with 2 week history of right knee pain.  Has been doing a walking program for about 3-4 months, but only recently has started having pain on the lateral aspect of her right knee.  Has rested for the past week and states the pain seems to have gotten worse than when she was walking. Denies any acute trauma or injury.  She has taken ibuprofen 600 mg 2-3 times daily which has helped significantly, but the pain always returns.  No history of arthritis or joint problems.  No prior injury to affected knee.   Also requesting refills of clotrimazole-betamethasone cream and valtrex.   Review of Systems  Constitutional: Negative for fever, chills and unexpected weight change.  Gastrointestinal: Negative for nausea and vomiting.  Musculoskeletal: Positive for arthralgias. Negative for joint swelling and gait problem.  Neurological: Negative for headaches.       Objective:   Physical Exam  Constitutional: She is oriented to person, place, and time. She appears well-developed and well-nourished.  HENT:  Head: Normocephalic and atraumatic.  Right Ear: External ear normal.  Left Ear: External ear normal.  Eyes: Conjunctivae are normal.  Neck: Normal range of motion.  Cardiovascular: Normal rate, regular rhythm and normal heart sounds.   Pulmonary/Chest: Effort normal and breath sounds normal.  Musculoskeletal:       Right knee: She exhibits normal range of motion, no swelling, no ecchymosis, normal patellar mobility, no bony tenderness, normal meniscus and no MCL laxity. No tenderness found. No medial joint line, no lateral joint line, no MCL, no LCL and no patellar tendon tenderness noted.       Left knee: Normal.  Lymphadenopathy:    She has no cervical adenopathy.  Neurological: She is alert and oriented to person, place, and time.  Psychiatric: She has a normal mood  and affect. Her behavior is normal. Judgment and thought content normal.         [facility] reading (PRIMARY) by  Dr. Katrinka Blazing as no acute bony abnormality.  Assessment & Plan:  1. Knee pain, right - Plan: DG Knee Complete 4 Views Right  -Prednisone taper  -Return to activity since this seemed to help with her pain  -If no improvement in 2-3 weeks, recommend referral to Orthopedics for further evaluation and treatment.  2. Herpes simplex type 2 infection - Plan: valACYclovir (VALTREX) 1000 MG tablet  -Refilled Valtrex 500 mg daily #90 RF x 1 3. Rash and nonspecific skin eruption - Plan: clotrimazole-betamethasone (LOTRISONE) cream, POCT glucose (manual entry)  -Refilled clotrimazole-betamethasone cream

## 2012-08-31 ENCOUNTER — Encounter: Payer: Self-pay | Admitting: Physician Assistant

## 2012-09-11 ENCOUNTER — Other Ambulatory Visit: Payer: Self-pay | Admitting: Physician Assistant

## 2012-09-12 ENCOUNTER — Telehealth: Payer: Self-pay

## 2012-09-12 DIAGNOSIS — M25561 Pain in right knee: Secondary | ICD-10-CM

## 2012-09-12 MED ORDER — PREDNISONE 20 MG PO TABS: ORAL_TABLET | ORAL | Status: DC

## 2012-09-12 NOTE — Telephone Encounter (Signed)
Called her to advise.  

## 2012-09-12 NOTE — Addendum Note (Signed)
Addended by: Sondra Barges on: 09/12/2012 03:11 PM   Modules accepted: Orders

## 2012-09-12 NOTE — Telephone Encounter (Signed)
Sent in

## 2012-09-12 NOTE — Telephone Encounter (Signed)
Pt called, she wants a refill on her prednisone. She states she went out of town and left the bottle. She had 5 pills left to take. Can we send in 5 pills so she can finish the course of the medication? Please advise.

## 2012-09-26 ENCOUNTER — Telehealth: Payer: Self-pay

## 2012-09-26 DIAGNOSIS — M25569 Pain in unspecified knee: Secondary | ICD-10-CM

## 2012-09-26 NOTE — Telephone Encounter (Signed)
Pt called stating that she saw heather recently for knee pain and she is still having pain and would like a referral to ortho and would like to talk with someone about the  Valtrex rx and the it is cheaper for her to get it 3 months at a time verses monthly  Best number [phone]

## 2012-09-28 NOTE — Telephone Encounter (Signed)
Orthopedic referral done. Our record indicates that she was given #90 tabs of Valtrex 1000mg  at a sig of 1/2 daily thus she has a 6 month supply at a time already.

## 2012-09-28 NOTE — Telephone Encounter (Signed)
No answer no vm

## 2012-09-29 ENCOUNTER — Telehealth: Payer: Self-pay

## 2012-09-29 DIAGNOSIS — M25561 Pain in right knee: Secondary | ICD-10-CM

## 2012-09-29 NOTE — Telephone Encounter (Signed)
Referral sent 

## 2012-09-29 NOTE — Telephone Encounter (Signed)
Called her to advise left message.  

## 2012-09-29 NOTE — Telephone Encounter (Signed)
PT STATES SHE WAS SEEN FOR RIGHT KNEE PAIN AND GIVEN PREDNISONE. WE WERE GOING TO REFER HER TO AN ORTH IF NO BETTER AND SHE WOULD LIKE TO BE REFERRED TO A SPECIALIST. PLEASE CALL [phone]

## 2012-09-29 NOTE — Telephone Encounter (Signed)
Pended referral please advise.  

## 2012-09-30 NOTE — Telephone Encounter (Signed)
Thanks have called her about the Rx have sent in for 90 with a refill.

## 2012-12-10 ENCOUNTER — Other Ambulatory Visit: Payer: Self-pay | Admitting: Obstetrics and Gynecology

## 2012-12-10 DIAGNOSIS — Z1231 Encounter for screening mammogram for malignant neoplasm of breast: Secondary | ICD-10-CM

## 2012-12-23 ENCOUNTER — Ambulatory Visit (HOSPITAL_COMMUNITY)
Admission: RE | Admit: 2012-12-23 | Discharge: 2012-12-23 | Disposition: A | Discharge status: Home / Self Care | Payer: 59 | Source: Ambulatory Visit | Attending: Obstetrics and Gynecology | Admitting: Obstetrics and Gynecology

## 2012-12-23 DIAGNOSIS — Z1231 Encounter for screening mammogram for malignant neoplasm of breast: Secondary | ICD-10-CM

## 2012-12-23 LAB — HM MAMMOGRAPHY

## 2013-04-19 ENCOUNTER — Ambulatory Visit (INDEPENDENT_AMBULATORY_CARE_PROVIDER_SITE_OTHER): Payer: 59 | Admitting: Family Medicine

## 2013-04-19 VITALS — BP 140/77 | HR 66 | Temp 98.3°F | Resp 18 | Wt 154.0 lb

## 2013-04-19 DIAGNOSIS — R059 Cough, unspecified: Secondary | ICD-10-CM

## 2013-04-19 DIAGNOSIS — R05 Cough: Secondary | ICD-10-CM

## 2013-04-19 DIAGNOSIS — J329 Chronic sinusitis, unspecified: Secondary | ICD-10-CM

## 2013-04-19 MED ORDER — AZITHROMYCIN 250 MG PO TABS: ORAL_TABLET | ORAL | Status: DC

## 2013-04-19 MED ORDER — BENZONATATE 200 MG PO CAPS: 200.0000 mg | ORAL_CAPSULE | Freq: Three times a day (TID) | ORAL | Status: DC | PRN

## 2013-04-19 NOTE — Patient Instructions (Signed)
Sinusitis  Sinusitis is redness, soreness, and swelling (inflammation) of the paranasal sinuses. Paranasal sinuses are air pockets within the bones of your face (beneath the eyes, the middle of the forehead, or above the eyes). In healthy paranasal sinuses, mucus is able to drain out, and air is able to circulate through them by way of your nose. However, when your paranasal sinuses are inflamed, mucus and air can become trapped. This can allow bacteria and other germs to grow and cause infection.  Sinusitis can develop quickly and last only a short time (acute) or continue over a long period (chronic). Sinusitis that lasts for more than 12 weeks is considered chronic.   CAUSES   Causes of sinusitis include:  · Allergies.  · Structural abnormalities, such as displacement of the cartilage that separates your nostrils (deviated septum), which can decrease the air flow through your nose and sinuses and affect sinus drainage.  · Functional abnormalities, such as when the small hairs (cilia) that line your sinuses and help remove mucus do not work properly or are not present.  SYMPTOMS   Symptoms of acute and chronic sinusitis are the same. The primary symptoms are pain and pressure around the affected sinuses. Other symptoms include:  · Upper toothache.  · Earache.  · Headache.  · Bad breath.  · Decreased sense of smell and taste.  · A cough, which worsens when you are lying flat.  · Fatigue.  · Fever.  · Thick drainage from your nose, which often is green and may contain pus (purulent).  · Swelling and warmth over the affected sinuses.  DIAGNOSIS   Your caregiver will perform a physical exam. During the exam, your caregiver may:  · Look in your nose for signs of abnormal growths in your nostrils (nasal polyps).  · Tap over the affected sinus to check for signs of infection.  · View the inside of your sinuses (endoscopy) with a special imaging device with a light attached (endoscope), which is inserted into your  sinuses.  If your caregiver suspects that you have chronic sinusitis, one or more of the following tests may be recommended:  · Allergy tests.  · Nasal culture A sample of mucus is taken from your nose and sent to a lab and screened for bacteria.  · Nasal cytology A sample of mucus is taken from your nose and examined by your caregiver to determine if your sinusitis is related to an allergy.  TREATMENT   Most cases of acute sinusitis are related to a viral infection and will resolve on their own within 10 days. Sometimes medicines are prescribed to help relieve symptoms (pain medicine, decongestants, nasal steroid sprays, or saline sprays).   However, for sinusitis related to a bacterial infection, your caregiver will prescribe antibiotic medicines. These are medicines that will help kill the bacteria causing the infection.   Rarely, sinusitis is caused by a fungal infection. In theses cases, your caregiver will prescribe antifungal medicine.  For some cases of chronic sinusitis, surgery is needed. Generally, these are cases in which sinusitis recurs more than 3 times per year, despite other treatments.  HOME CARE INSTRUCTIONS   · Drink plenty of water. Water helps thin the mucus so your sinuses can drain more easily.  · Use a humidifier.  · Inhale steam 3 to 4 times a day (for example, sit in the bathroom with the shower running).  · Apply a warm, moist washcloth to your face 3 to 4 times a day,   or as directed by your caregiver.  · Use saline nasal sprays to help moisten and clean your sinuses.  · Take over-the-counter or prescription medicines for pain, discomfort, or fever only as directed by your caregiver.  SEEK IMMEDIATE MEDICAL CARE IF:  · You have increasing pain or severe headaches.  · You have nausea, vomiting, or drowsiness.  · You have swelling around your face.  · You have vision problems.  · You have a stiff neck.  · You have difficulty breathing.  MAKE SURE YOU:   · Understand these  instructions.  · Will watch your condition.  · Will get help right away if you are not doing well or get worse.  Document Released: 04/30/2005 Document Revised: 07/23/2011 Document Reviewed: 05/15/2011  ExitCare® Patient Information ©2014 [employer].  Cough, Adult   A cough is a reflex that helps clear your throat and airways. It can help heal the body or may be a reaction to an irritated airway. A cough may only last 2 or 3 weeks (acute) or may last more than 8 weeks (chronic).   CAUSES  Acute cough:  · Viral or bacterial infections.  Chronic cough:  · Infections.  · Allergies.  · Asthma.  · Post-nasal drip.  · Smoking.  · Heartburn or acid reflux.  · Some medicines.  · Chronic lung problems (COPD).  · Cancer.  SYMPTOMS   · Cough.  · Fever.  · Chest pain.  · Increased breathing rate.  · High-pitched whistling sound when breathing (wheezing).  · Colored mucus that you cough up (sputum).  TREATMENT   · A bacterial cough may be treated with antibiotic medicine.  · A viral cough must run its course and will not respond to antibiotics.  · Your caregiver may recommend other treatments if you have a chronic cough.  HOME CARE INSTRUCTIONS   · Only take over-the-counter or prescription medicines for pain, discomfort, or fever as directed by your caregiver. Use cough suppressants only as directed by your caregiver.  · Use a cold steam vaporizer or humidifier in your bedroom or home to help loosen secretions.  · Sleep in a semi-upright position if your cough is worse at night.  · Rest as needed.  · Stop smoking if you smoke.  SEEK IMMEDIATE MEDICAL CARE IF:   · You have pus in your sputum.  · Your cough starts to worsen.  · You cannot control your cough with suppressants and are losing sleep.  · You begin coughing up blood.  · You have difficulty breathing.  · You develop pain which is getting worse or is uncontrolled with medicine.  · You have a fever.  MAKE SURE YOU:   · Understand these instructions.  · Will watch your  condition.  · Will get help right away if you are not doing well or get worse.  Document Released: 10/27/2010 Document Revised: 07/23/2011 Document Reviewed: 10/27/2010  ExitCare® Patient Information ©2014 [employer].

## 2013-04-19 NOTE — Progress Notes (Signed)
° °  Subjective:  This chart was scribed for Elvina Sidle, MD by Carl Best, Medical Scribe. This patient was seen in Room 14 and the patient's care was started at 9:51 AM.  [name], female    DOB: [birth year], 57 y.o.   MRN: [id number]  HPI HPI Comments: [name] [name] is a 57 y.o. female who presents to the [facility] complaining of cough productive of brown sputum and nasal congestion that started a week ago.  She states that she had a fever earlier in the week.  She states that her cough has inhibited her from sleeping.  She states that she has taken Advil, Tylenol Flu, and Alka Seltzer Plus with relief to her fever.  The patient denies taking Prednisone.  She states that she is taking Valtrex and Menest.  She states that she is allergic to Codeine.  She denies having a history of asthma.  She states that she is a nonsmoker.  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  She states that her job requires her to be on her feet all day.  She states that she works 4 pm-midnight.  She states that she is divorced with three kids.  She states that they are all grown up and doing well.    No past medical history on file. No past surgical history on file. No family history on file. History   Social History   Marital Status: Single    Spouse Name: N/A    Number of Children: N/A   Years of Education: N/A   Occupational History   Not on file.   Social History Main Topics   Smoking status: Never Smoker    Smokeless tobacco: Not on file   Alcohol Use: 0.5 oz/week    1 drink(s) per week   Drug Use: No   Sexual Activity: Yes    Birth Control/ Protection: None   Other Topics Concern   Not on file   Social History Narrative   No narrative on file   Allergies  Allergen Reactions   Codeine     REACTION: itching     Review of Systems  Constitutional: Positive for fever.  HENT: Positive for congestion.     Respiratory: Positive for cough.   Psychiatric/Behavioral: Positive for sleep disturbance.  All other systems reviewed and are negative.      Objective:  Physical Exam No acute distress HEENT: Unremarkable except for swollen nasal passages which are red and inflamed Chest: Few rhonchi Heart: Regular no murmur Skin: Without rashes or suspicious lesions   Assessment & Plan:   I personally performed the services described in this documentation, which was scribed in my presence. The recorded information has been reviewed and is accurate.  Sinusitis - Plan: azithromycin (ZITHROMAX Z-PAK) 250 MG tablet  Cough - Plan: benzonatate (TESSALON) 200 MG capsule  Signed, Elvina Sidle, MD

## 2013-05-12 ENCOUNTER — Encounter: Payer: Self-pay | Admitting: Obstetrics and Gynecology

## 2013-05-13 ENCOUNTER — Encounter: Payer: Self-pay | Admitting: Obstetrics and Gynecology

## 2013-05-13 ENCOUNTER — Ambulatory Visit (INDEPENDENT_AMBULATORY_CARE_PROVIDER_SITE_OTHER): Payer: 59 | Admitting: Obstetrics and Gynecology

## 2013-05-13 VITALS — BP 138/62 | HR 58 | Resp 16 | Ht 62.25 in | Wt 157.6 lb

## 2013-05-13 DIAGNOSIS — Z01419 Encounter for gynecological examination (general) (routine) without abnormal findings: Secondary | ICD-10-CM

## 2013-05-13 DIAGNOSIS — L659 Nonscarring hair loss, unspecified: Secondary | ICD-10-CM

## 2013-05-13 DIAGNOSIS — Z0000 Encounter for general adult medical examination without abnormal findings: Secondary | ICD-10-CM

## 2013-05-13 DIAGNOSIS — E559 Vitamin D deficiency, unspecified: Secondary | ICD-10-CM

## 2013-05-13 LAB — LIPID PANEL
HDL: 84 mg/dL (ref >39)
LDL Cholesterol: 108 mg/dL — ABNORMAL HIGH (ref 0–99)
Total CHOL/HDL Ratio: 2.5 Ratio
VLDL: 22 mg/dL (ref 0–40)

## 2013-05-13 LAB — HEMOGLOBIN, FINGERSTICK: Hemoglobin, fingerstick: 13.2 g/dL (ref 12.0–16.0)

## 2013-05-13 LAB — CBC
HCT: 37.8 % (ref 36.0–46.0)
MCH: 31 pg (ref 26.0–34.0)
RDW: 12.6 % (ref 11.5–15.5)
WBC: 4.5 10*3/uL (ref 4.0–10.5)

## 2013-05-13 LAB — THYROID PANEL WITH TSH: Free Thyroxine Index: 2.7 (ref 1.0–3.9)

## 2013-05-13 LAB — COMPREHENSIVE METABOLIC PANEL
AST: 23 U/L (ref 0–37)
Alkaline Phosphatase: 66 U/L (ref 39–117)
BUN: 12 mg/dL (ref 6–23)
CO2: 25 mEq/L (ref 19–32)
Calcium: 9.1 mg/dL (ref 8.4–10.5)
Chloride: 102 mEq/L (ref 96–112)
Creat: 0.84 mg/dL (ref 0.50–1.10)
Glucose, Bld: 96 mg/dL (ref 70–99)
Potassium: 3.9 mEq/L (ref 3.5–5.3)
Sodium: 138 mEq/L (ref 135–145)
Total Bilirubin: 0.3 mg/dL (ref 0.3–1.2)
Total Protein: 6.7 g/dL (ref 6.0–8.3)

## 2013-05-13 MED ORDER — ESTERIFIED ESTROGENS 0.3 MG PO TABS: 0.9000 mg | ORAL_TABLET | Freq: Every day | ORAL | Status: DC

## 2013-05-13 MED ORDER — ESTROGENS, CONJUGATED 0.625 MG/GM VA CREA: 1.0000 | TOPICAL_CREAM | Freq: Every day | VAGINAL | Status: DC

## 2013-05-13 NOTE — Patient Instructions (Signed)
EXERCISE AND DIET:  We recommended that you start or continue a regular exercise program for good health. Regular exercise means any activity that makes your heart beat faster and makes you sweat.  We recommend exercising at least 30 minutes per day at least 3 days a week, preferably 4 or 5.  We also recommend a diet low in fat and sugar.  Inactivity, poor dietary choices and obesity can cause diabetes, heart attack, stroke, and kidney damage, among others.    ALCOHOL AND SMOKING:  Women should limit their alcohol intake to no more than 7 drinks/beers/glasses of wine (combined, not each!) per week. Moderation of alcohol intake to this level decreases your risk of breast cancer and liver damage. And of course, no recreational drugs are part of a healthy lifestyle.  And absolutely no smoking or even second hand smoke. Most people know smoking can cause heart and lung diseases, but did you know it also contributes to weakening of your bones? Aging of your skin?  Yellowing of your teeth and nails?  CALCIUM AND VITAMIN D:  Adequate intake of calcium and Vitamin D are recommended.  The recommendations for exact amounts of these supplements seem to change often, but generally speaking 600 mg of calcium (either carbonate or citrate) and 800 units of Vitamin D per day seems prudent. Certain women may benefit from higher intake of Vitamin D.  If you are among these women, your doctor will have told you during your visit.    PAP SMEARS:  Pap smears, to check for cervical cancer or precancers,  have traditionally been done yearly, although recent scientific advances have shown that most women can have pap smears less often.  However, every woman still should have a physical exam from her gynecologist every year. It will include a breast check, inspection of the vulva and vagina to check for abnormal growths or skin changes, a visual exam of the cervix, and then an exam to evaluate the size and shape of the uterus and  ovaries.  And after 58 years of age, a rectal exam is indicated to check for rectal cancers. We will also provide age appropriate advice regarding health maintenance, like when you should have certain vaccines, screening for sexually transmitted diseases, bone density testing, colonoscopy, mammograms, etc.   MAMMOGRAMS:  All women over 40 years old should have a yearly mammogram. Many facilities now offer a "3D" mammogram, which may cost around $50 extra out of pocket. If possible,  we recommend you accept the option to have the 3D mammogram performed.  It both reduces the number of women who will be called back for extra views which then turn out to be normal, and it is better than the routine mammogram at detecting truly abnormal areas.    COLONOSCOPY:  Colonoscopy to screen for colon cancer is recommended for all women at age 50.  We know, you hate the idea of the prep.  We agree, BUT, having colon cancer and not knowing it is worse!!  Colon cancer so often starts as a polyp that can be seen and removed at colonscopy, which can quite literally save your life!  And if your first colonoscopy is normal and you have no family history of colon cancer, most women don't have to have it again for 10 years.  Once every ten years, you can do something that may end up saving your life, right?  We will be happy to help you get it scheduled when you are ready.    Be sure to check your insurance coverage so you understand how much it will cost.  It may be covered as a preventative service at no cost, but you should check your particular policy.     Conjugated Estrogens vaginal cream What is this medicine? CONJUGATED ESTROGENS (CON ju gate ed ESS troe jenz) are a mixture of female hormones. This cream can help relieve symptoms associated with menopause.like vaginal dryness and irritation. This medicine may be used for other purposes; ask your health care provider or pharmacist if you have questions. COMMON BRAND  NAME(S): Premarin What should I tell my health care provider before I take this medicine? They need to know if you have any of these conditions: -abnormal vaginal bleeding -blood vessel disease or blood clots -breast, cervical, endometrial, or uterine cancer -dementia -diabetes -gallbladder disease -heart disease or recent heart attack -high blood pressure -high cholesterol -high level of calcium in the blood -hysterectomy -kidney disease -liver disease -migraine headaches -protein C deficiency -protein S deficiency -stroke -systemic lupus erythematosus (SLE) -tobacco smoker -an unusual or allergic reaction to estrogens other medicines, foods, dyes, or preservatives -pregnant or trying to get pregnant -breast-feeding How should I use this medicine? This medicine is for use in the vagina only. Do not take by mouth. Follow the directions on the prescription label. Use at bedtime unless otherwise directed by your doctor or health care professional. Use the special applicator supplied with the cream. Wash hands before and after use. Fill the applicator with the cream and remove from the tube. Lie on your back, part and bend your knees. Insert the applicator into the vagina and push the plunger to expel the cream into the vagina. Wash the applicator with warm soapy water and rinse well. Use exactly as directed for the complete length of time prescribed. Do not stop using except on the advice of your doctor or health care professional. Talk to your pediatrician regarding the use of this medicine in children. Special care may be needed. A patient package insert for the product will be given with each prescription and refill. Read this sheet carefully each time. The sheet may change frequently. Overdosage: If you think you have taken too much of this medicine contact a poison control center or emergency room at once. NOTE: This medicine is only for you. Do not share this medicine with  others. What if I miss a dose? If you miss a dose, use it as soon as you can. If it is almost time for your next dose, use only that dose. Do not use double or extra doses. What may interact with this medicine? Do not take this medicine with any of the following medications: -aromatase inhibitors like aminoglutethimide, anastrozole, exemestane, letrozole, testolactone This medicine may also interact with the following medications: -barbiturates used for inducing sleep or treating seizures -carbamazepine -grapefruit juice -medicines for fungal infections like itraconazole and ketoconazole -raloxifene or tamoxifen -rifabutin -rifampin -rifapentine -ritonavir -some antibiotics used to treat infections -St. John's Wort -warfarin This list may not describe all possible interactions. Give your health care provider a list of all the medicines, herbs, non-prescription drugs, or dietary supplements you use. Also tell them if you smoke, drink alcohol, or use illegal drugs. Some items may interact with your medicine. What should I watch for while using this medicine? Visit your health care professional for regular checks on your progress. You will need a regular breast and pelvic exam. You should also discuss the need for regular mammograms with your   health care professional, and follow his or her guidelines. This medicine can make your body retain fluid, making your fingers, hands, or ankles swell. Your blood pressure can go up. Contact your doctor or health care professional if you feel you are retaining fluid. If you have any reason to think you are pregnant; stop taking this medicine at once and contact your doctor or health care professional. Tobacco smoking increases the risk of getting a blood clot or having a stroke, especially if you are more than 58 years old. You are strongly advised not to smoke. If you wear contact lenses and notice visual changes, or if the lenses begin to feel  uncomfortable, consult your eye care specialist. If you are going to have elective surgery, you may need to stop taking this medicine beforehand. Consult your health care professional for advice prior to scheduling the surgery. What side effects may I notice from receiving this medicine? Side effects that you should report to your doctor or health care professional as soon as possible: -allergic reactions like skin rash, itching or hives, swelling of the face, lips, or tongue -breast tissue changes or discharge -changes in vision -chest pain -confusion, trouble speaking or understanding -dark urine -general ill feeling or flu-like symptoms -light-colored stools -nausea, vomiting -pain, swelling, warmth in the leg -right upper belly pain -severe headaches -shortness of breath -sudden numbness or weakness of the face, arm or leg -trouble walking, dizziness, loss of balance or coordination -unusual vaginal bleeding -yellowing of the eyes or skin Side effects that usually do not require medical attention (report to your doctor or health care professional if they continue or are bothersome): -hair loss -increased hunger or thirst -increased urination -symptoms of vaginal infection like itching, irritation or unusual discharge -unusually weak or tired This list may not describe all possible side effects. Call your doctor for medical advice about side effects. You may report side effects to FDA at 1-800-FDA-1088. Where should I keep my medicine? Keep out of the reach of children. Store at room temperature between 15 and 30 degrees C (59 and 86 degrees F). Throw away any unused medicine after the expiration date. NOTE: This sheet is a summary. It may not cover all possible information. If you have questions about this medicine, talk to your doctor, pharmacist, or health care provider.  2014, Elsevier/Gold Standard. (2010-08-02 09:20:36)  

## 2013-05-13 NOTE — Progress Notes (Signed)
57 y.o. SingleAfrican American female   G3P3 here for annual exam.    Has a history of dyspareunia. Had laparoscopic BSO due to this.  Continues to have pain.  Feels like an obstruction.  Has not done vaginal estrogen treatment. On Menest 0.3 mg, taking three daily.  Saw dermatologist one month ago due to hair thinning.  Encouraged to check TFTs.  Received Rx from them.  Doesn't remember the name.   Patient's last menstrual period was 01/13/2000.          Sexually active: yes  The current method of family planning is status post hysterectomy.    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  - 2010 Exercising: yes  walking and work out on treadmill Last mammogram: 12/23/12 normal Last pap: 08/24/03 WNL Last BMD: never Alcohol: once weekly Tobacco: none   Health Maintenance  Topic Date Due  . Pap Smear  07/17/1972  . Tetanus/tdap  07/17/1973  . Colonoscopy  07/17/2004  . Influenza Vaccine  12/12/2012  . Mammogram  12/24/2014    Family History  Problem Relation Age of Onset  . Cervical cancer Mother   . Colon cancer Brother 48  . Other Sister     x2 Behcet's syndrome    Patient Active Problem List   Diagnosis Date Noted  . HSV-2 (herpes simplex virus 2) infection 05/14/2012    Past Medical History  Diagnosis Date  . Anemia   . Enlarged uterus     c/w fibroids    Past Surgical History  Procedure Laterality Date  . Tubal ligation    . Foot surgery  1996    bilateral nerve injection  . Vaginal hysterectomy  9/01  . Laparoscopic bso  09/13/08     lysis of adhesions-done secondary dysparenia post TVH-tubal dysplasia/CIS    Allergies: Codeine  Current Outpatient Prescriptions  Medication Sig Dispense Refill  . clobetasol (TEMOVATE) 0.05 % external solution as needed.      Marland Kitchen Esterified Estrogens (MENEST) 0.3 MG tablet Take 0.9 mg by mouth daily.      . valACYclovir (VALTREX) 1000 MG tablet Take 0.5 tablets (500 mg total) by  mouth daily.  90 tablet  1  . Vitamin D, Ergocalciferol, (DRISDOL) 50000 UNITS CAPS capsule Take 50,000 Units by mouth every 7 (seven) days.       No current facility-administered medications for this visit.   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  ROS: Pertinent items are noted in HPI.  Exam:    BP 138/62  Pulse 58  Resp 16  Ht 5' 2.25" (1.581 m)  Wt 157 lb 9.6 oz (71.487 kg)  BMI 28.60 kg/m2  LMP 01/13/2000 Weight change: @WEIGHTCHANGE @ Last 3 height recordings:  Ht Readings from Last 3 Encounters:  05/13/13 5' 2.25" (1.581 m)  08/30/12 5\' 2"  (1.575 m)   General appearance: alert Head: Normocephalic, without obvious abnormality, atraumatic Neck: no adenopathy, supple, symmetrical, trachea midline and thyroid not enlarged, symmetric, no tenderness/mass/nodules Lungs: clear to auscultation bilaterally Breasts: normal appearance, no masses or tenderness, No nipple retraction or dimpling, No axillary or supraclavicular adenopathy Heart: regular rate and rhythm, S1, S2 normal, no murmur, click, rub or gallop Abdomen: normal findings: no organomegaly and soft, non-tender Extremities: extremities normal, atraumatic, no cyanosis or edema Skin: Skin color, texture, turgor normal. No rashes or lesions Lymph nodes: Cervical, supraclavicular, and axillary nodes normal. no inguinal nodes palpated Neurologic: Grossly normal   Pelvic: External genitalia:  no lesions              Urethra: not indicated and normal appearing urethra with no masses, tenderness or lesions              Bartholins and Skenes: normal                 Vagina: normal appearing vagina with normal color and discharge, no lesions              Cervix: absent              Pap taken: no        Bimanual Exam:  Uterus:  uterus is normal size, shape, consistency and nontender, surgically absent, vaginal cuff well healed                                      Adnexa:    not indicated and surgically absent bilateral                                       Rectovaginal: Confirms                                      Anus:  normal sphincter tone, no lesions  A: no contraindication to continue hormonal therapy Dyspareunia. Alopecia.  P: mammogram pap smear counseled on use and side effects of HRT.  Discussed risks and benefits of ERT - thromboembolic events, breast cancer.   Will continue Menest. Will add Premarin vaginal cream to regimen. See Epic orders. TFT's, lipid profile, CBC, CMP Follow up in 3 months return annually or prn      An After Visit Summary was printed and given to the patient.

## 2013-05-14 LAB — VITAMIN D 25 HYDROXY (VIT D DEFICIENCY, FRACTURES): Vit D, 25-Hydroxy: 39 ng/mL (ref 30–89)

## 2013-05-18 ENCOUNTER — Other Ambulatory Visit: Payer: Self-pay | Admitting: Physician Assistant

## 2013-05-22 ENCOUNTER — Ambulatory Visit: Payer: Self-pay | Admitting: Obstetrics and Gynecology

## 2013-06-03 ENCOUNTER — Other Ambulatory Visit: Payer: Self-pay | Admitting: Physician Assistant

## 2013-06-19 ENCOUNTER — Ambulatory Visit (INDEPENDENT_AMBULATORY_CARE_PROVIDER_SITE_OTHER): Payer: 59 | Admitting: Internal Medicine

## 2013-06-19 VITALS — BP 138/76 | HR 76 | Temp 98.0°F | Resp 16 | Ht 62.0 in | Wt 155.0 lb

## 2013-06-19 DIAGNOSIS — J019 Acute sinusitis, unspecified: Secondary | ICD-10-CM

## 2013-06-19 MED ORDER — AMOXICILLIN 875 MG PO TABS: 875.0000 mg | ORAL_TABLET | Freq: Two times a day (BID) | ORAL | Status: DC

## 2013-06-19 NOTE — Patient Instructions (Signed)
Sudafed 60 mg every 6 hrs as needed for congestion

## 2013-06-19 NOTE — Progress Notes (Signed)
Subjective:    [name], female    DOB: 11-May-1956, 58 y.o.   MRN: [id number] This chart was scribed for Tami Lin, MD by Anastasia Pall, ED Scribe. This patient was seen in room 01 and the patient's care was started at 6:23 PM.  Chief Complaint  Patient presents with  . Shortness of Breath  . Nasal Congestion    5 days  . Headache  . Chills    HPI [name] [name] is a 58 y.o. female Pt presents with SOB, nasal congestion, headache, and chills, onset 5 days ago. She reports the worst symptoms are her congestion and trouble breathing. She states that when she lies down to sleep she cannot breathe out of her nose.  She reports associated cough and fever usually in the evenings. She reports taking Ibuprofen, with relief for her fever. She reports taking Mucinex, without relief. She reports bilateral ear pain. She denies any other symptoms.   PCP - Osborne Casco, MD  Patient Active Problem List   Diagnosis Date Noted  . HSV-2 (herpes simplex virus 2) infection 05/14/2012   Prior to Admission medications   Medication Sig Start Date End Date Taking? Authorizing Provider  conjugated estrogens (PREMARIN) vaginal cream Place 1 Applicatorful vaginally daily. Use 1/2 g vaginally every night at bed time for the first 2 weeks, then use 1/2 g vaginally two or three times per week as needed to maintain symptom relief. 05/13/13  Yes Western, MD  Esterified Estrogens (MENEST) 0.3 MG tablet Take 3 tablets (0.9 mg total) by mouth daily. 05/13/13  Yes [place] Berton Lan, MD  Vitamin D, Ergocalciferol, (DRISDOL) 50000 UNITS CAPS capsule Take 50,000 Units by mouth every 7 (seven) days.   Yes Historical Provider, MD    Review of Systems  Constitutional: Positive for fever (in evenings) and chills.  HENT: Positive for congestion (nasal) and ear pain (bilateral).   Respiratory: Positive for cough and shortness of breath.     Cardiovascular: Negative for chest pain.  Neurological: Positive for headaches.      Objective:   Physical Exam  Nursing note and vitals reviewed. Constitutional: She is oriented to person, place, and time. She appears well-developed and well-nourished. No distress.  HENT:  Head: Normocephalic and atraumatic.  Right Ear: External ear normal.  Mouth/Throat: Oropharynx is clear and moist. No oropharyngeal exudate.  Fluid behind bilateral TM. Nasal congestion.   Eyes: Conjunctivae and EOM are normal. Pupils are equal, round, and reactive to light.  Neck: Neck supple. No thyromegaly present.  Cardiovascular: Normal rate, regular rhythm and normal heart sounds.   No murmur heard. Pulmonary/Chest: Effort normal and breath sounds normal. No respiratory distress. She has no wheezes. She has no rales.  Musculoskeletal: Normal range of motion.  Lymphadenopathy:    She has no cervical adenopathy.  Neurological: She is alert and oriented to person, place, and time.  Skin: Skin is warm and dry.  Psychiatric: She has a normal mood and affect. Her behavior is normal.    BP 138/76  Pulse 76  Temp(Src) 98 F (36.7 C) (Oral)  Resp 16  Ht 5\' 2"  (1.575 m)  Wt 155 lb (70.308 kg)  BMI 28.34 kg/m2  SpO2 99%  LMP 01/13/2000     Assessment & Plan:   Acute sinusitis, unspecified  Meds ordered this encounter  Medications  . amoxicillin (AMOXIL) 875 MG tablet    Sig: Take 1  tablet (875 mg total) by mouth 2 (two) times daily.    Dispense:  20 tablet    Refill:  0       I have completed the patient encounter in its entirety as documented by the scribe, with editing by me where necessary. Lareta Bruneau P. Laney Pastor, M.D.

## 2013-06-20 ENCOUNTER — Other Ambulatory Visit: Payer: Self-pay

## 2013-06-20 NOTE — Telephone Encounter (Signed)
Patient was seen at 102 yesterday and forgot to request a refill for her clotrimazole when she saw Dr. Laney Pastor. Please put a refill request in and return her call. Thank you!

## 2013-06-21 MED ORDER — CLOTRIMAZOLE-BETAMETHASONE 1-0.05 % EX CREA: 1.0000 "application " | TOPICAL_CREAM | Freq: Two times a day (BID) | CUTANEOUS | Status: DC

## 2013-08-12 ENCOUNTER — Ambulatory Visit (INDEPENDENT_AMBULATORY_CARE_PROVIDER_SITE_OTHER): Payer: 59 | Admitting: Obstetrics and Gynecology

## 2013-08-12 ENCOUNTER — Encounter: Payer: Self-pay | Admitting: Obstetrics and Gynecology

## 2013-08-12 VITALS — BP 110/60 | HR 64 | Ht 62.25 in | Wt 155.6 lb

## 2013-08-12 DIAGNOSIS — IMO0002 Reserved for concepts with insufficient information to code with codable children: Secondary | ICD-10-CM

## 2013-08-12 NOTE — Progress Notes (Signed)
[name], female   DOB: 1955/07/02, 58 y.o.   MRN: [id number]  GYNECOLOGY  VISIT   HPI: 58 y.o.   Single  female   G3P3 with Patient's last menstrual period was 01/13/2000.   here for   A recheck for atrophic vaginal symptoms.  Using Premarin cream 1/2 gram twice a week. Intercourse is not painful, but still a little tender. Still feels somewhat like an obstruction.  This has occurred since hysterectomy.  Patient had BSO and tubal dysplasia, CIS,  was identified on a tube.   GYNECOLOGIC HISTORY: Patient's last menstrual period was 01/13/2000. Contraception:    Menopausal hormone therapy:         OB History   Grav Para Term Preterm Abortions TAB SAB Ect Mult Living   3 3        3          Patient Active Problem List   Diagnosis Date Noted  . HSV-2 (herpes simplex virus 2) infection 05/14/2012    Past Medical History  Diagnosis Date  . Anemia   . Enlarged uterus     c/w fibroids    Past Surgical History  Procedure Laterality Date  . Foot surgery  1996    bilateral nerve injection  . Vaginal hysterectomy  9/01  . Laparoscopic bso  09/13/08     lysis of adhesions-done secondary dysparenia post TVH-tubal dysplasia/CIS  . Tubal ligation  1980  . Pelvic laparoscopy  09-13-08    BSO/lysis of adhesions secondary to dyspareunia    Current Outpatient Prescriptions  Medication Sig Dispense Refill  . clobetasol (TEMOVATE) 0.05 % external solution Apply 1 application topically daily.      . clotrimazole-betamethasone (LOTRISONE) cream Apply 1 application topically 2 (two) times daily.  30 g  0  . conjugated estrogens (PREMARIN) vaginal cream Place 1 Applicatorful vaginally daily. Use 1/2 g vaginally every night at bed time for the first 2 weeks, then use 1/2 g vaginally two or three times per week as needed to maintain symptom relief.  60 g  3  . Esterified Estrogens (MENEST) 0.3 MG tablet Take 3 tablets (0.9 mg total) by mouth daily.  270 tablet  3  .  valACYclovir (VALTREX) 1000 MG tablet Take 1 g by mouth every other day.       No current facility-administered medications for this visit.     ALLERGIES: Codeine  Family History  Problem Relation Age of Onset  . Cervical cancer Mother   . Colon cancer Brother 28  . Other Sister     x2 Behcet's syndrome  . Other Sister     Behcet's Syndrome    History   Social History  . Marital Status: Single    Spouse Name: N/A    Number of Children: N/A  . Years of Education: N/A   Occupational History  . Not on file.   Social History Main Topics  . Smoking status: Never Smoker   . Smokeless tobacco: Never Used  . Alcohol Use: 0.5 oz/week    1 drink(s) per week     Comment: occ  . Drug Use: No  . Sexual Activity: Yes    Birth Control/ Protection: None, Surgical     Comment: TVH   Other Topics Concern  . Not on file   Social History Narrative  . No narrative on file    ROS:  Pertinent items are noted in HPI.  PHYSICAL EXAMINATION:    BP 110/60  Pulse 64  Ht 5' 2.25" (1.581 m)  Wt 155 lb 9.6 oz (70.58 kg)  BMI 28.24 kg/m2  LMP 01/13/2000     General appearance: alert, cooperative and appears stated age   Pelvic: External genitalia:  no lesions              Urethra:  normal appearing urethra with no masses, tenderness or lesions              Bartholins and Skenes: normal                 Vagina: normal appearing vagina with normal color and discharge, no lesions.  No erythema.  Some rugae noted.               Cervix:  absent                   Bimanual Exam:  Uterus:   absent                                      Adnexa: no masses                                      Rectovaginal: Confirms                                      Anus:  normal sphincter tone, no lesions  ASSESSMENT    Atrophic vaginal changes - improved. Vaginal obstruction symptoms.  History of tubal dysplasia.  Status post laparoscopic BSO. Status post TVH.  PLAN  Increase to Premarin 1/2  gram pv at hs 3 times a week.  Return for pelvic ultrasound. Use KY jelly or Astroglide prn.      An After Visit Summary was printed and given to the patient.  __15____ minutes face to face time of which over 50% was spent in counseling.

## 2013-08-12 NOTE — Patient Instructions (Signed)
We will call you to schedule the pelvic vaginal ultrasound.  Please increase your Premarin cream to 1/2 gram per vagina three nights a week.  [place] or Astroglide at your pharmacy and use for lubrication at any time, especially during intercourse.

## 2013-08-13 ENCOUNTER — Telehealth: Payer: Self-pay | Admitting: Obstetrics and Gynecology

## 2013-08-13 NOTE — Telephone Encounter (Signed)
Left message for patient to call back. Need to go over benefits and schedule PUS °

## 2013-08-17 NOTE — Telephone Encounter (Signed)
Spoke with patient. Advised of $17 copay quoted for PUS.Scheduled PUS. Advised patient of cancellation policy.  Mailed the In-Office procedure form that includes appointment date and time, patient copay, and cancellation policy.

## 2013-08-17 NOTE — Telephone Encounter (Signed)
Returning a call to Sabrina. °

## 2013-09-24 ENCOUNTER — Ambulatory Visit (INDEPENDENT_AMBULATORY_CARE_PROVIDER_SITE_OTHER): Payer: 59

## 2013-09-24 ENCOUNTER — Encounter: Payer: Self-pay | Admitting: Obstetrics and Gynecology

## 2013-09-24 ENCOUNTER — Ambulatory Visit (INDEPENDENT_AMBULATORY_CARE_PROVIDER_SITE_OTHER): Payer: 59 | Admitting: Obstetrics and Gynecology

## 2013-09-24 VITALS — BP 130/76 | HR 60 | Ht 62.25 in | Wt 158.0 lb

## 2013-09-24 DIAGNOSIS — IMO0002 Reserved for concepts with insufficient information to code with codable children: Secondary | ICD-10-CM

## 2013-09-24 NOTE — Progress Notes (Signed)
GYNECOLOGY  VISIT   HPI: 58 y.o.   Single  African American  female   G3P3 with Patient's last menstrual period was 01/13/2000.   here for  Pelvic ultrasound. Patient with dyspareunia.  Feels like an obstruction.  Status post laparoscopic BSO - final pathology showed tubal dysplasia, CIS.   Using Premarin cream three times a week.  Some improvement.  Using KY jelly as well.   GYNECOLOGIC HISTORY: Patient's last menstrual period was 01/13/2000. Contraception:    Menopausal hormone therapy:         OB History   Grav Para Term Preterm Abortions TAB SAB Ect Mult Living   3 3        3          Patient Active Problem List   Diagnosis Date Noted  . HSV-2 (herpes simplex virus 2) infection 05/14/2012    Past Medical History  Diagnosis Date  . Anemia   . Enlarged uterus     c/w fibroids    Past Surgical History  Procedure Laterality Date  . Foot surgery  1996    bilateral nerve injection  . Vaginal hysterectomy  9/01  . Laparoscopic bso  09/13/08     lysis of adhesions-done secondary dysparenia post TVH-tubal dysplasia/CIS  . Tubal ligation  1980  . Pelvic laparoscopy  09-13-08    BSO/lysis of adhesions secondary to dyspareunia    Current Outpatient Prescriptions  Medication Sig Dispense Refill  . clobetasol (TEMOVATE) 0.05 % external solution Apply 1 application topically daily.      . clotrimazole-betamethasone (LOTRISONE) cream Apply 1 application topically 2 (two) times daily.  30 g  0  . conjugated estrogens (PREMARIN) vaginal cream Place 1 Applicatorful vaginally daily. Use 1/2 g vaginally every night at bed time for the first 2 weeks, then use 1/2 g vaginally two or three times per week as needed to maintain symptom relief.  60 g  3  . Esterified Estrogens (MENEST) 0.3 MG tablet Take 3 tablets (0.9 mg total) by mouth daily.  270 tablet  3  . valACYclovir (VALTREX) 1000 MG tablet Take 1 g by mouth every other day.       No current facility-administered medications  for this visit.     ALLERGIES: Codeine  Family History  Problem Relation Age of Onset  . Cervical cancer Mother   . Colon cancer Brother 50  . Other Sister     x2 Behcet's syndrome  . Other Sister     Behcet's Syndrome    History   Social History  . Marital Status: Single    Spouse Name: N/A    Number of Children: N/A  . Years of Education: N/A   Occupational History  . Not on file.   Social History Main Topics  . Smoking status: Never Smoker   . Smokeless tobacco: Never Used  . Alcohol Use: 0.5 oz/week    1 drink(s) per week     Comment: occ  . Drug Use: No  . Sexual Activity: Yes    Birth Control/ Protection: None, Surgical     Comment: TVH   Other Topics Concern  . Not on file   Social History Narrative  . No narrative on file    ROS:  Pertinent items are noted in HPI.  PHYSICAL EXAMINATION:    BP 130/76  Pulse 60  Ht 5' 2.25" (1.581 m)  Wt 158 lb (71.668 kg)  BMI 28.67 kg/m2  LMP 01/13/2000  General appearance: alert, cooperative and appears stated age    Ultrasound of pelvis - no masses noted.  No free fluid.   ASSESSMENT   Dyspareunia.  Symptoms improved with Premarin vaginal cream. Status post lap BSO with tubal dysplasia noted, CIS.  Normal pelvic ultrasound.   PLAN  Continue Premarin vaginal cream 1/2 gm 2 -3 times a week.  Return for annual exam and prn.    An After Visit Summary was printed and given to the patient.  _15_____ minutes face to face time of which over 50% was spent in counseling.

## 2013-11-19 ENCOUNTER — Other Ambulatory Visit: Payer: Self-pay | Admitting: Obstetrics and Gynecology

## 2013-11-19 DIAGNOSIS — Z1231 Encounter for screening mammogram for malignant neoplasm of breast: Secondary | ICD-10-CM

## 2013-12-24 ENCOUNTER — Ambulatory Visit (HOSPITAL_COMMUNITY): Payer: 59

## 2014-02-17 ENCOUNTER — Telehealth: Payer: Self-pay | Admitting: Obstetrics and Gynecology

## 2014-02-17 NOTE — Telephone Encounter (Signed)
Call to pt that we need to reschedule her appt in January. Lm for pt to that i canceled the appt and for her to call back and reschedule.

## 2014-03-01 ENCOUNTER — Ambulatory Visit (HOSPITAL_COMMUNITY): Payer: 59

## 2014-03-15 ENCOUNTER — Encounter: Payer: Self-pay | Admitting: Obstetrics and Gynecology

## 2014-03-28 ENCOUNTER — Other Ambulatory Visit: Payer: Self-pay | Admitting: Obstetrics and Gynecology

## 2014-03-29 NOTE — Telephone Encounter (Signed)
Pt has schedule AEX on 06/04/14 @ 9:45

## 2014-03-29 NOTE — Telephone Encounter (Signed)
Last AEX/Last refilled: 05/13/13 #60 gm/3 rfs   Left Message To Call Back to schedule AEX

## 2014-03-30 ENCOUNTER — Ambulatory Visit (HOSPITAL_COMMUNITY)
Admission: RE | Admit: 2014-03-30 | Discharge: 2014-03-30 | Disposition: A | Discharge status: Home / Self Care | Payer: 59 | Source: Ambulatory Visit | Attending: Obstetrics and Gynecology | Admitting: Obstetrics and Gynecology

## 2014-03-30 DIAGNOSIS — Z1231 Encounter for screening mammogram for malignant neoplasm of breast: Secondary | ICD-10-CM | POA: Diagnosis not present

## 2014-03-30 NOTE — Telephone Encounter (Signed)
Last AEX: 05/13/13 with Dr. Leretha Dykes scheduled for 06/04/14 with Ms. Patty Last Mammogram: 12/23/2012 ; scheduled for 03/31/14  Please advise.

## 2014-05-19 ENCOUNTER — Ambulatory Visit: Payer: 59 | Admitting: Obstetrics and Gynecology

## 2014-06-04 ENCOUNTER — Ambulatory Visit: Payer: Self-pay | Admitting: Nurse Practitioner

## 2014-06-20 ENCOUNTER — Other Ambulatory Visit: Payer: Self-pay | Admitting: Obstetrics and Gynecology

## 2014-06-21 NOTE — Telephone Encounter (Signed)
Medication refill request: Menest 0.3 mg tab Last AEX:  05/13/13 Next AEX: 08/16/14 PG Last MMG (if hormonal medication request): 03/30/14 BIRADS1:Neg Refill authorized: 05/13/13 #270tab/3R. Today #270tab/0R?

## 2014-06-22 NOTE — Telephone Encounter (Signed)
This is a 90 day Rx.  Patient will need to keep appointment for April for further refills.

## 2014-06-23 NOTE — Telephone Encounter (Signed)
Left Voicemail for patient with Dr. Elza Rafter message.- per DPR.

## 2014-08-16 ENCOUNTER — Ambulatory Visit (INDEPENDENT_AMBULATORY_CARE_PROVIDER_SITE_OTHER): Payer: 59 | Admitting: Nurse Practitioner

## 2014-08-16 ENCOUNTER — Encounter: Payer: Self-pay | Admitting: Nurse Practitioner

## 2014-08-16 VITALS — BP 120/76 | HR 64 | Ht 62.25 in | Wt 162.0 lb

## 2014-08-16 DIAGNOSIS — Z01419 Encounter for gynecological examination (general) (routine) without abnormal findings: Secondary | ICD-10-CM

## 2014-08-16 DIAGNOSIS — Z0000 Encounter for general adult medical examination without abnormal findings: Secondary | ICD-10-CM

## 2014-08-16 LAB — POCT URINALYSIS DIPSTICK
BILIRUBIN UA: NEGATIVE
Blood, UA: NEGATIVE
Glucose, UA: NEGATIVE
Ketones, UA: NEGATIVE
LEUKOCYTES UA: NEGATIVE
Nitrite, UA: NEGATIVE
Protein, UA: NEGATIVE
Urobilinogen, UA: NEGATIVE
pH, UA: 6.5

## 2014-08-16 MED ORDER — ESTROGENS, CONJUGATED 0.625 MG/GM VA CREA: TOPICAL_CREAM | VAGINAL | Status: DC

## 2014-08-16 MED ORDER — VALACYCLOVIR HCL 1 G PO TABS: 1.0000 g | ORAL_TABLET | ORAL | Status: DC

## 2014-08-16 MED ORDER — ESTERIFIED ESTROGENS 0.3 MG PO TABS: ORAL_TABLET | ORAL | Status: DC

## 2014-08-16 NOTE — Patient Instructions (Signed)

## 2014-08-16 NOTE — Progress Notes (Signed)
[name], female   DOB: [birth year], 59 y.o.   MRN: [id number] 59 y.o. G3P3 Divorced  African American Fe here for annual exam.  Same partner for 15 years.  She remains on ERT and wants to continue.  She is trying to loose weight despite exercise 4 times a week still has weight issues.  Patient's last menstrual period was 01/13/2000.          Sexually active: Yes.    The current method of family planning is status post hysterectomy.    Exercising: Yes.    walking, sit-ups and work out video 4 days per week Smoker:  no  Health Maintenance: Pap:  09/02/03, negative MMG:  03/30/14, Bi-Rads 1:  Negative Colonoscopy:  03/13/10, normal, repeat in 10 years BMD:   Never  TDaP:  2010 Labs:  HB:  13.4  Urine:  Negative    reports that she has never smoked. She has never used smokeless tobacco. She reports that she drinks about 0.5 oz of alcohol per week. She reports that she does not use illicit drugs.  Past Medical History  Diagnosis Date  . Anemia   . Enlarged uterus     c/w fibroids    Past Surgical History  Procedure Laterality Date  . Foot surgery  1996    bilateral nerve injection  . Vaginal hysterectomy  9/01  . Laparoscopic bso  09/13/08     lysis of adhesions-done secondary dysparenia post TVH-tubal dysplasia/CIS  . Tubal ligation  1980  . Pelvic laparoscopy  09-13-08    BSO/lysis of adhesions secondary to dyspareunia    Current Outpatient Prescriptions  Medication Sig Dispense Refill  . clobetasol (TEMOVATE) 0.05 % external solution Apply 1 application topically daily.    Marland Kitchen conjugated estrogens (PREMARIN) vaginal cream USE 1/2 GRAM VAGINALLY AT BEDTIME twice weekly 60 g 3  . Esterified Estrogens (MENEST) 0.3 MG tablet TAKE 3 TABLETS (0.9 MG TOTAL) BY MOUTH DAILY. 270 tablet 3  . valACYclovir (VALTREX) 1000 MG tablet Take 1 tablet (1,000 mg total) by mouth every other day. 90 tablet 3   No current facility-administered medications for this visit.    Family  History  Problem Relation Age of Onset  . Cervical cancer Mother   . Colon cancer Brother 32  . Other Sister     x2 Behcet's syndrome  . Other Sister     Behcet's Syndrome    ROS:  Pertinent items are noted in HPI.  Otherwise, a comprehensive ROS was negative.  Exam:   BP 120/76 mmHg  Pulse 64  Ht 5' 2.25" (1.581 m)  Wt 162 lb (73.483 kg)  BMI 29.40 kg/m2  LMP 01/13/2000 Height: 5' 2.25" (158.1 cm) Ht Readings from Last 3 Encounters:  08/16/14 5' 2.25" (1.581 m)  09/24/13 5' 2.25" (1.581 m)  08/12/13 5' 2.25" (1.581 m)    General appearance: alert, cooperative and appears stated age Head: Normocephalic, without obvious abnormality, atraumatic Neck: no adenopathy, supple, symmetrical, trachea midline and thyroid normal to inspection and palpation Lungs: clear to auscultation bilaterally Breasts: normal appearance, no masses or tenderness Heart: regular rate and rhythm Abdomen: soft, non-tender; no masses,  no organomegaly Extremities: extremities normal, atraumatic, no cyanosis or edema Skin: Skin color, texture, turgor normal. No rashes or lesions Lymph nodes: Cervical, supraclavicular, and axillary nodes normal. No abnormal inguinal nodes palpated Neurologic: Grossly normal   Pelvic: External genitalia:  no lesions  Urethra:  normal appearing urethra with no masses, tenderness or lesions              Bartholin's and Skene's: normal                 Vagina: normal appearing vagina with normal color and discharge, no lesions              Cervix: absent              Pap taken: No. Bimanual Exam:  Uterus:  uterus absent              Adnexa: no mass, fullness, tenderness               Rectovaginal: Confirms               Anus:  normal sphincter tone, no lesions  Chaperone present: no   A:  Well Woman with normal exam  S/P TVH secondary to fibroids and adenomyosis 01/2000  S/P BSO and LOA secondary to endometriosis/ tubal dysplasia/ CIS - seen by specialist  and no other intervention is needed per Dr. Janie Morning  ERT from 07/30/2000 - present  Atrophic vaginitis - better on vaginal estrogen cream  History of  HSV ? date    P:   Reviewed health and wellness pertinent to exam  Pap smear not taken today  Mammogram is due 11/16  Refill on ERT Menest 0.3 mg - will try to taper every other day with 2 tabs and next day 3 tabs over the summer, by fall hopefully will be down to 2 tabs alternate with 1 tablet every other day.  Counseled with risk of CVA, DVT, cancer, etc  She will return for fasting labs.  Refill on Valtrex for a year  Counseled with weight loss - prefer no diet pills for her, exercise, calcium, Vit D, sleep. return annually or  An After Visit Summary was printed and given to the patient.

## 2014-08-17 LAB — HEMOGLOBIN, FINGERSTICK: Hemoglobin, fingerstick: 13.4 g/dL (ref 12.0–16.0)

## 2014-08-19 NOTE — Progress Notes (Signed)
Encounter reviewed by Dr. Aunisty Reali Silva.  

## 2014-08-30 ENCOUNTER — Other Ambulatory Visit (INDEPENDENT_AMBULATORY_CARE_PROVIDER_SITE_OTHER): Payer: 59

## 2014-08-30 DIAGNOSIS — Z0000 Encounter for general adult medical examination without abnormal findings: Secondary | ICD-10-CM

## 2014-08-30 LAB — COMPREHENSIVE METABOLIC PANEL
ALBUMIN: 4.2 g/dL (ref 3.5–5.2)
ALT: 14 U/L (ref 0–35)
AST: 16 U/L (ref 0–37)
Alkaline Phosphatase: 61 U/L (ref 39–117)
BUN: 17 mg/dL (ref 6–23)
CALCIUM: 9.3 mg/dL (ref 8.4–10.5)
CO2: 28 meq/L (ref 19–32)
Chloride: 102 mEq/L (ref 96–112)
Creat: 0.8 mg/dL (ref 0.50–1.10)
GLUCOSE: 100 mg/dL — AB (ref 70–99)
Potassium: 4.6 mEq/L (ref 3.5–5.3)
Sodium: 139 mEq/L (ref 135–145)
Total Bilirubin: 0.6 mg/dL (ref 0.2–1.2)
Total Protein: 6.8 g/dL (ref 6.0–8.3)

## 2014-08-30 LAB — LIPID PANEL
Cholesterol: 175 mg/dL (ref 0–200)
HDL: 89 mg/dL (ref >=46)
LDL CALC: 68 mg/dL (ref 0–99)
TRIGLYCERIDES: 88 mg/dL (ref <150)
Total CHOL/HDL Ratio: 2 Ratio
VLDL: 18 mg/dL (ref 0–40)

## 2014-08-30 LAB — TSH: TSH: 2.824 u[IU]/mL (ref 0.350–4.500)

## 2014-08-31 LAB — VITAMIN D 25 HYDROXY (VIT D DEFICIENCY, FRACTURES): VIT D 25 HYDROXY: 25 ng/mL — AB (ref 30–100)

## 2014-09-06 ENCOUNTER — Telehealth: Payer: Self-pay | Admitting: Nurse Practitioner

## 2014-09-06 NOTE — Telephone Encounter (Signed)
Spoke with patient. Patient states that she discussed weight loss medication with Milford Cage, FNP at her appointment on 4/4. "She just wanted me to have some lab work done before I got started on it. I had the lab work done on the 18th. I have a free trial of Belviq I can use but only if I have a prescription written." Advised patient will need to speak with Milford Cage, FNP regarding rx and return call. Patient is agreeable. Patient states when she picked up her rx for Valtrex she was only given 45 pills. Rx is for 1000 mg every other day. "She wants me to have extra in case I have an outbreak because then I have to take more and I run out before the month is up." Advised will speak with Milford Cage, FNP regarding instructions on medication and make adjustments as needed. Patient is agreeable.

## 2014-09-06 NOTE — Telephone Encounter (Signed)
Pt states she would like to speak to Ms Patty's nurse regarding belviq. States she spoke with ms patty regarding this during last appointment.

## 2014-09-06 NOTE — Telephone Encounter (Signed)
She may have more Valtrex with # 90 refill X 3.  She did tell me that she was frustrated with weight and we did discuss getting labs done to include TSH.  But we do not do weight loss medication - this has to be done by PCP.

## 2014-09-07 MED ORDER — VALACYCLOVIR HCL 1 G PO TABS: ORAL_TABLET | ORAL | Status: DC

## 2014-09-07 NOTE — Telephone Encounter (Signed)
Yes OK to make daily

## 2014-09-07 NOTE — Telephone Encounter (Signed)
Rx changed to Valtrex 1000mg  daily #90 3RF and sent to pharmacy on file.  Routing to provider for final review. Patient agreeable to disposition. Will close encounter

## 2014-09-07 NOTE — Telephone Encounter (Signed)
Spoke with patient. Advised of message as seen below from Milford Cage, [place]. Patient is agreeable and verbalizes understanding. Patient will contact PCP regarding weight loss medication.   Milford Cage, FNP patient's current rx for Valtrex is 1000mg  every other day #90 3RF which was written on 08/16/2014. Okay to change to take daily so she may have 90 day supply. Patient aware will still need to take 500mg  daily but extra pills for outbreaks.

## 2015-02-01 ENCOUNTER — Telehealth: Payer: Self-pay | Admitting: Nurse Practitioner

## 2015-02-01 MED ORDER — VALACYCLOVIR HCL 1 G PO TABS: ORAL_TABLET | ORAL | Status: DC

## 2015-02-01 NOTE — Telephone Encounter (Signed)
Spoke with patient. Patient states that her prescription for her Valtrex was written as take one tablet every other day. Is only able to pick up 45 tablets at a time. "I take the medication every day and I am running out and cant refill it when I need to." Advised patient that rx that was sent on 09/07/2014 for Valtrex 1000 MG was written to take daily #90. Advised will resend rx to verify instructions and quantity. Rx for Valtrex 1000 MG take one tablet daily #90 2RF sent to pharmacy on file. Patient is agreeable. States that she has been using Premarin since April and it is "not working." Patient would like to try an alternative at this time. Advised will speak with Kem Boroughs, FNP and return call. Patient is agreeable.

## 2015-02-01 NOTE — Telephone Encounter (Signed)
She may try Premarin vaginal cream - some people do better with that at 1/2 gm three times a week initially then reduce to twice a week after 6-8 weeks.  Please send in RX.

## 2015-02-01 NOTE — Telephone Encounter (Signed)
Patient calling in regards to valtrex refill, directions that pharmacy received is 1 tablet every other day. Needing our office to change directions.  Patient also says that premarin vaginal cream is not working and was told by Ms. Patty to call us if this rx didn't work.  Best contact # for patient: [phone]

## 2015-02-02 MED ORDER — ESTRADIOL 0.1 MG/GM VA CREA: TOPICAL_CREAM | VAGINAL | Status: DC

## 2015-02-02 NOTE — Telephone Encounter (Signed)
Kem Boroughs, FNP please review. Patient is currently using Premarin. Would you like her to keep using Premarin just at a different dose or does she need a alternative cream as well?

## 2015-02-02 NOTE — Telephone Encounter (Signed)
I was mistaken she did have a sample of Estrace at one time - so thought that was the one she was on.  Now I see she was on Premarin already.  So lets go back to Estrace 1/2 gm 3 times a week for 6-8 weeks and have her to call with a progress report.

## 2015-02-02 NOTE — Telephone Encounter (Signed)
Spoke with patient. Advised of message as seen below from [name] [name], [place]. Patient is agreeable and verbalizes understanding. Rx for Estrace 1/2 gram 3 times a week for 6-8 weeks 0RF sent to pharmacy on file. Aware she will need to return call with progress report after this time period.  Routing to provider for final review. Patient agreeable to disposition. Will close encounter.

## 2015-03-24 ENCOUNTER — Other Ambulatory Visit: Payer: Self-pay

## 2015-03-24 DIAGNOSIS — Z1231 Encounter for screening mammogram for malignant neoplasm of breast: Secondary | ICD-10-CM

## 2015-03-29 ENCOUNTER — Telehealth: Payer: Self-pay | Admitting: Nurse Practitioner

## 2015-03-29 NOTE — Telephone Encounter (Signed)
Good news have her to continue as planned.

## 2015-03-29 NOTE — Telephone Encounter (Signed)
Patient was told to give a medication update to [place]. Last seen 08/30/14.

## 2015-03-29 NOTE — Telephone Encounter (Signed)
Spoke with patient. Patient is currently using Estrace cream 1/2 gram three times a week. Has been doing this for 7 weeks. States that she feels symptoms are better since starting the Estrace cream. "They are better than before with the other cream." Was previously using Premarin. Advised I will provide Kem Boroughs, FNP an update and if she has any additional recommendations regarding directions of use I will return call. Patient is agreeable.

## 2015-03-30 NOTE — Telephone Encounter (Signed)
Spoke with patient. Advised of message as seen below from Patricia Grubb, FNP. Patient is agreeable and verbalizes understanding.  Routing to provider for final review. Patient agreeable to disposition. Will close encounter.  

## 2015-04-13 ENCOUNTER — Ambulatory Visit
Admission: RE | Admit: 2015-04-13 | Discharge: 2015-04-13 | Disposition: A | Discharge status: Home / Self Care | Payer: Commercial Managed Care - HMO | Source: Ambulatory Visit

## 2015-04-13 DIAGNOSIS — Z1231 Encounter for screening mammogram for malignant neoplasm of breast: Secondary | ICD-10-CM

## 2015-05-12 ENCOUNTER — Other Ambulatory Visit: Payer: Self-pay | Admitting: Nurse Practitioner

## 2015-05-12 NOTE — Telephone Encounter (Signed)
Medication refill request: Estrace Vaginal cream Last AEX:  08/16/2014 PG Next AEX: 08/22/2015 PG Last MMG (if hormonal medication request): 04/13/2015 BIRADS category 1 Negative Refill authorized: 02/02/15 42.5 g Quanity 1 with 0 Refills   Today: Quanity 1 with 0 Refills ? Please advise

## 2015-05-13 ENCOUNTER — Encounter: Payer: Self-pay | Admitting: Internal Medicine

## 2015-08-22 ENCOUNTER — Encounter: Payer: Self-pay | Admitting: Nurse Practitioner

## 2015-08-22 ENCOUNTER — Other Ambulatory Visit: Payer: Self-pay | Admitting: Nurse Practitioner

## 2015-08-22 ENCOUNTER — Ambulatory Visit (INDEPENDENT_AMBULATORY_CARE_PROVIDER_SITE_OTHER): Payer: Commercial Managed Care - HMO | Admitting: Nurse Practitioner

## 2015-08-22 VITALS — BP 120/64 | HR 60 | Ht 62.25 in | Wt 162.0 lb

## 2015-08-22 DIAGNOSIS — Z01419 Encounter for gynecological examination (general) (routine) without abnormal findings: Secondary | ICD-10-CM | POA: Diagnosis not present

## 2015-08-22 DIAGNOSIS — Z1211 Encounter for screening for malignant neoplasm of colon: Secondary | ICD-10-CM | POA: Diagnosis not present

## 2015-08-22 DIAGNOSIS — E2839 Other primary ovarian failure: Secondary | ICD-10-CM | POA: Diagnosis not present

## 2015-08-22 DIAGNOSIS — Z0000 Encounter for general adult medical examination without abnormal findings: Secondary | ICD-10-CM | POA: Diagnosis not present

## 2015-08-22 DIAGNOSIS — E559 Vitamin D deficiency, unspecified: Secondary | ICD-10-CM | POA: Diagnosis not present

## 2015-08-22 LAB — CBC WITH DIFFERENTIAL/PLATELET
BASOS ABS: 51 {cells}/uL (ref 0–200)
Basophils Relative: 1 %
Eosinophils Absolute: 102 cells/uL (ref 15–500)
Eosinophils Relative: 2 %
HCT: 39.1 % (ref 35.0–45.0)
Hemoglobin: 13.1 g/dL (ref 11.7–15.5)
LYMPHS PCT: 57 %
Lymphs Abs: 2907 cells/uL (ref 850–3900)
MCH: 30.4 pg (ref 27.0–33.0)
MCHC: 33.5 g/dL (ref 32.0–36.0)
MCV: 90.7 fL (ref 80.0–100.0)
MONOS PCT: 11 %
MPV: 9.3 fL (ref 7.5–12.5)
Monocytes Absolute: 561 cells/uL (ref 200–950)
NEUTROS PCT: 29 %
Neutro Abs: 1479 cells/uL — ABNORMAL LOW (ref 1500–7800)
Platelets: 333 10*3/uL (ref 140–400)
RBC: 4.31 MIL/uL (ref 3.80–5.10)
RDW: 13 % (ref 11.0–15.0)
WBC: 5.1 10*3/uL (ref 3.8–10.8)

## 2015-08-22 LAB — POCT URINALYSIS DIPSTICK
Bilirubin, UA: NEGATIVE
Glucose, UA: NEGATIVE
KETONES UA: NEGATIVE
Leukocytes, UA: NEGATIVE
Nitrite, UA: NEGATIVE
PH UA: 5
PROTEIN UA: NEGATIVE
RBC UA: NEGATIVE
Urobilinogen, UA: NEGATIVE

## 2015-08-22 LAB — COMPREHENSIVE METABOLIC PANEL
ALT: 14 U/L (ref 6–29)
AST: 16 U/L (ref 10–35)
Albumin: 4.2 g/dL (ref 3.6–5.1)
Alkaline Phosphatase: 65 U/L (ref 33–130)
BUN: 16 mg/dL (ref 7–25)
CHLORIDE: 104 mmol/L (ref 98–110)
CO2: 26 mmol/L (ref 20–31)
Calcium: 9.3 mg/dL (ref 8.6–10.4)
Creat: 0.87 mg/dL (ref 0.50–0.99)
Glucose, Bld: 101 mg/dL — ABNORMAL HIGH (ref 65–99)
POTASSIUM: 3.8 mmol/L (ref 3.5–5.3)
Sodium: 140 mmol/L (ref 135–146)
TOTAL PROTEIN: 6.7 g/dL (ref 6.1–8.1)
Total Bilirubin: 0.3 mg/dL (ref 0.2–1.2)

## 2015-08-22 LAB — LIPID PANEL
Cholesterol: 178 mg/dL (ref 125–200)
HDL: 92 mg/dL (ref >=46)
LDL Cholesterol: 74 mg/dL (ref <130)
Total CHOL/HDL Ratio: 1.9 Ratio (ref <=5.0)
Triglycerides: 62 mg/dL (ref <150)
VLDL: 12 mg/dL (ref <30)

## 2015-08-22 LAB — HEPATITIS C ANTIBODY: HCV AB: NEGATIVE

## 2015-08-22 LAB — TSH: TSH: 2.09 m[IU]/L

## 2015-08-22 LAB — HIV ANTIBODY (ROUTINE TESTING W REFLEX): HIV 1&2 Ab, 4th Generation: NONREACTIVE

## 2015-08-22 MED ORDER — ESTERIFIED ESTROGENS 0.3 MG PO TABS: ORAL_TABLET | ORAL | Status: DC

## 2015-08-22 MED ORDER — VALACYCLOVIR HCL 1 G PO TABS: ORAL_TABLET | ORAL | Status: DC

## 2015-08-22 NOTE — Progress Notes (Signed)
Reviewed personally.  M. Suzanne Anders Hohmann, MD.  

## 2015-08-22 NOTE — Patient Instructions (Addendum)

## 2015-08-22 NOTE — Progress Notes (Signed)
[name], female   DOB: 1955/11/11, 60 y.o.   MRN: [id number]  60 y.o. G3P3 Divorced  African American Fe here for annual exam.  Now has tapered from 3 tabs of Menest to 2 tabs and will be tapering further this year down to 1 tablet and off by next spring if tolerated.  Same partner.  Is not using estrogen cream as she thought it was not helpful.  Still some dyspareunia.  Advised to use OTC lubrication.  Patient's last menstrual period was 01/13/2000 (approximate).          Sexually active: Yes.    The current method of family planning is tubal ligation and status post hysterectomy.    Exercising: Yes.    walking and sit ups 3 times per week Smoker:  no  Health Maintenance: Pap:09/02/03, Negative MMG:04/03/15, Bi-Rads 1: Negative Colonoscopy: 03/13/10, normal, repeat in 10 years BMD: Never  TDaP: 2010 Shingles: Never Pneumonia: Not indicated due to age Hep C and HIV: done today Labs: HB: 13.2  Urine: Negative    reports that she has never smoked. She has never used smokeless tobacco. She reports that she drinks about 0.5 oz of alcohol per week. She reports that she does not use illicit drugs.  Past Medical History  Diagnosis Date  . Anemia   . Enlarged uterus     c/w fibroids    Past Surgical History  Procedure Laterality Date  . Foot surgery  1996    bilateral nerve injection  . Vaginal hysterectomy  9/01  . Laparoscopic bso  09/13/08     lysis of adhesions-done secondary dysparenia post TVH-tubal dysplasia/CIS  . Tubal ligation  1980  . Pelvic laparoscopy  09-13-08    BSO/lysis of adhesions secondary to dyspareunia    Current Outpatient Prescriptions  Medication Sig Dispense Refill  . clobetasol (TEMOVATE) 0.05 % external solution Apply 1 application topically daily.    . Esterified Estrogens (MENEST) 0.3 MG tablet TAKE 3 TABLETS (0.9 MG TOTAL) BY MOUTH DAILY. 270 tablet 3  . valACYclovir (VALTREX) 1000 MG tablet Take one tablet daily (1,000 mg). 90  tablet 2   No current facility-administered medications for this visit.    Family History  Problem Relation Age of Onset  . Cervical cancer Mother   . Colon cancer Brother 78  . Other Sister     x2 Behcet's syndrome  . Other Sister     Behcet's Syndrome    ROS:  Pertinent items are noted in HPI.  Otherwise, a comprehensive ROS was negative.  Exam:   BP 120/64 mmHg  Pulse 60  Ht 5' 2.25" (1.581 m)  Wt 162 lb (73.483 kg)  BMI 29.40 kg/m2  LMP 01/13/2000 (Approximate) Height: 5' 2.25" (158.1 cm) Ht Readings from Last 3 Encounters:  08/22/15 5' 2.25" (1.581 m)  08/16/14 5' 2.25" (1.581 m)  09/24/13 5' 2.25" (1.581 m)    General appearance: alert, cooperative and appears stated age Head: Normocephalic, without obvious abnormality, atraumatic Neck: no adenopathy, supple, symmetrical, trachea midline and thyroid normal to inspection and palpation Lungs: clear to auscultation bilaterally Breasts: normal appearance, no masses or tenderness Heart: regular rate and rhythm Abdomen: soft, non-tender; no masses,  no organomegaly Extremities: extremities normal, atraumatic, no cyanosis or edema Skin: Skin color, texture, turgor normal. No rashes or lesions Lymph nodes: Cervical, supraclavicular, and axillary nodes normal. No abnormal inguinal nodes palpated Neurologic: Grossly normal   Pelvic: External genitalia:  no lesions  Urethra:  normal appearing urethra with no masses, tenderness or lesions              Bartholin's and Skene's: normal                 Vagina: normal appearing vagina with normal color and discharge, no lesions              Cervix: absent              Pap taken: Yes.   Bimanual Exam:  Uterus:  uterus absent              Adnexa: no mass, fullness, tenderness               Rectovaginal: Confirms               Anus:  normal sphincter tone, no lesions  Chaperone present: yes  A:  Well Woman with normal exam  S/P TVH secondary to fibroids and  adenomyosis 01/2000 S/P BSO and LOA secondary to endometriosis/ tubal dysplasia/ CIS - seen by specialist and no other intervention is needed per Dr. Janie Morning ERT from 07/30/2000 - present Atrophic vaginitis - better on vaginal estrogen cream History of HSV ? date   P:   Reviewed health and wellness pertinent to exam  Pap smear as above  Mammogram is due 03/2015 and will get BMD at same time - baseline  Counseled with risk of DVT, CVA, cancer, etc.  Refill on Valtrex for a year  IFOB is given  Follow with labs  Counseled on breast self exam, mammography screening, use and side effects of HRT, adequate intake of calcium and vitamin D, diet and exercise return annually or prn  An After Visit Summary was printed and given to the patient.

## 2015-08-23 LAB — HEMOGLOBIN, FINGERSTICK: HEMOGLOBIN, FINGERSTICK: 13.2 g/dL (ref 12.0–16.0)

## 2015-08-23 LAB — VITAMIN D 25 HYDROXY (VIT D DEFICIENCY, FRACTURES): VIT D 25 HYDROXY: 32 ng/mL (ref 30–100)

## 2015-08-24 LAB — IPS PAP TEST WITH HPV

## 2015-08-24 LAB — HEMOGLOBIN A1C
HEMOGLOBIN A1C: 5.9 % — AB (ref <5.7)
Mean Plasma Glucose: 123 mg/dL

## 2015-11-03 ENCOUNTER — Other Ambulatory Visit: Payer: Self-pay | Admitting: Nurse Practitioner

## 2015-11-03 NOTE — Telephone Encounter (Signed)
Medication refill request: Menest 0.3mg  Last AEX:  08/22/15 PG Next AEX: 08/22/2016 Last MMG (if hormonal medication request): 04/13/15 BIRADS1 Refill authorized: Esterified Estrogens 0.3mg  Tabs #270 3R. Please advise. Thank you.

## 2016-03-02 ENCOUNTER — Other Ambulatory Visit: Payer: Self-pay | Admitting: Nurse Practitioner

## 2016-03-02 DIAGNOSIS — Z1231 Encounter for screening mammogram for malignant neoplasm of breast: Secondary | ICD-10-CM

## 2016-04-16 ENCOUNTER — Ambulatory Visit: Payer: Commercial Managed Care - HMO

## 2016-04-16 ENCOUNTER — Ambulatory Visit
Admission: RE | Admit: 2016-04-16 | Discharge: 2016-04-16 | Disposition: A | Discharge status: Home / Self Care | Payer: Commercial Managed Care - HMO | Source: Ambulatory Visit | Attending: Nurse Practitioner | Admitting: Nurse Practitioner

## 2016-04-16 DIAGNOSIS — Z1231 Encounter for screening mammogram for malignant neoplasm of breast: Secondary | ICD-10-CM

## 2016-04-16 DIAGNOSIS — E2839 Other primary ovarian failure: Secondary | ICD-10-CM

## 2016-07-12 HISTORY — PX: ABDOMINAL SURGERY: SHX537

## 2016-07-21 ENCOUNTER — Other Ambulatory Visit: Payer: Self-pay | Admitting: Nurse Practitioner

## 2016-07-23 NOTE — Telephone Encounter (Signed)
Medication refill request: valtrex  Last AEX:  08/22/15 PG Next AEX: 08/22/16 PG  Last MMG (if hormonal medication request): 04/16/16 BIRADS1:Neg  Refill authorized: 08/22/15 #90tabs/2R. Today please advise.

## 2016-08-20 NOTE — Progress Notes (Signed)
61 y.o. G23P0003 Divorced [place] American female here for annual exam.    On ERT.  Was taking Menest 0.3 mg three times a day.  Now down to once per day.  Trying to wean off. Has some vaginal estrogen left over.   Still having pain with intercourse since her hysterectomy in her 54s.  Feels like she is having bladder pain with intercourse.  No urinary incontinence. No spontaneous pain.   Normal pelvic ultrasound in 2015.   Hx elevated hemoglobin A1C, 5.9 last year.  Happy with retirement.  Visits her daughter and grandchildren in [place]. Wants to start volunteering.   PCP: Kelton Pillar, MD    Patient's last menstrual period was 01/13/2000 (approximate).           Sexually active: Yes.   female The current method of family planning is tubal ligation/hysterectomy.    Exercising: Yes.    Walks 3days/week/40 minutes Smoker:  no  Health Maintenance: Pap: 08-22-15 Neg:Neg HR HPV;2005 Neg History of abnormal Pap:  no MMG: 04-16-16 Density B/Neg/BiRads1:TBC Colonoscopy: 03-13-10 normal;next due 02/2020 BMD: 04-16-16  Result:Normal:TBC.Marland Kitchen Density rating B. TDaP:  2010 Gardasil:   n/a HIV:  NR 08/22/15. Hep C:  Neg 08/22/15. Screening Labs:   Urine today: not done   reports that she has never smoked. She has never used smokeless tobacco. She reports that she drinks about 0.5 oz of alcohol per week . She reports that she does not use drugs.  Past Medical History:  Diagnosis Date  . Anemia   . Elevated hemoglobin A1c   . Enlarged uterus    c/w fibroids    Past Surgical History:  Procedure Laterality Date  . ABDOMINAL SURGERY  07/2016   liposuction  . FOOT SURGERY  1996   bilateral nerve injection  . laparoscopic BSO  09/13/08    lysis of adhesions-done secondary dysparenia post TVH-tubal dysplasia/CIS  . PELVIC LAPAROSCOPY  09-13-08   BSO/lysis of adhesions secondary to dyspareunia  . TUBAL LIGATION  1980  . VAGINAL HYSTERECTOMY  9/01    Current Outpatient Prescriptions   Medication Sig Dispense Refill  . clobetasol (TEMOVATE) 0.05 % external solution Apply 1 application topically daily.    Marland Kitchen MENEST 0.3 MG tablet TAKE 3 TABLETS (0.9 MG TOTAL) BY MOUTH DAILY. 270 tablet 0  . valACYclovir (VALTREX) 1000 MG tablet TAKE 1 TABLET BY MOUTH DAILY 90 tablet 0   No current facility-administered medications for this visit.     Family History  Problem Relation Age of Onset  . Cervical cancer Mother   . Colon cancer Brother 19  . Other Sister     x2 Behcet's syndrome  . Other Sister     Behcet's Syndrome    ROS:  Pertinent items are noted in HPI.  Otherwise, a comprehensive ROS was negative.  Exam:   BP 104/70 (BP Location: Right Arm, Patient Position: Sitting, Cuff Size: Normal)   Pulse 66   Resp 20   Ht 5\' 2"  (1.575 m)   Wt 162 lb 3.2 oz (73.6 kg)   LMP 01/13/2000 (Approximate)   BMI 29.67 kg/m     General appearance: alert, cooperative and appears stated age Head: Normocephalic, without obvious abnormality, atraumatic Neck: no adenopathy, supple, symmetrical, trachea midline and thyroid normal to inspection and palpation Lungs: clear to auscultation bilaterally Breasts: normal appearance, no masses or tenderness, No nipple retraction or dimpling, No nipple discharge or bleeding, No axillary or supraclavicular adenopathy Heart: regular rate and rhythm Abdomen: soft, non-tender;  no masses, no organomegaly Extremities: extremities normal, atraumatic, no cyanosis or edema Skin: Skin color, texture, turgor normal. No rashes or lesions Lymph nodes: Cervical, supraclavicular, and axillary nodes normal. No abnormal inguinal nodes palpated Neurologic: Grossly normal  Pelvic: External genitalia:  no lesions              Urethra:  normal appearing urethra with no masses, tenderness or lesions              Bartholins and Skenes: normal                 Vagina: normal appearing vagina with normal color and discharge, no lesions              Cervix: absent.               Pap taken: No. Bimanual Exam:  Uterus:  Absent.              Adnexa: no mass, fullness, tenderness              Rectal exam: Yes.  .  Confirms.              Anus:  normal sphincter tone, no lesions  Chaperone was present for exam.  Assessment:   Well woman visit with normal exam. Status post TVH. 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 Dyspareunia. Chronic.  Normal pelvic ultrasound.  Atrophy?  Adhesive disease?  Vaginismus? Hx elevated HgbA1C.   Plan: Mammogram screening discussed. Recommended self breast awareness. Pap and HR HPV as above. Guidelines for Calcium, Vitamin D, regular exercise program including cardiovascular and weight bearing exercise. Routine labs and HgbA1C.  Refill of Mensest 0.3 mg x 1 year.  Discussed potential risks of stroke, DVT, PE, and breast cancer. Start Premarin vaginal cream 1/2 gram pv at hs x 2 weeks, then 1/2 gram twice weekly.  Dicussed potential risks of breast cancer.  Will consider pelvic floor therapy.  She elected not to do this right now. Follow up annually and prn.   After visit summary provided.

## 2016-08-22 ENCOUNTER — Ambulatory Visit: Payer: Commercial Managed Care - HMO | Admitting: Nurse Practitioner

## 2016-08-23 ENCOUNTER — Ambulatory Visit (INDEPENDENT_AMBULATORY_CARE_PROVIDER_SITE_OTHER): Payer: Commercial Managed Care - HMO | Admitting: Obstetrics and Gynecology

## 2016-08-23 ENCOUNTER — Encounter: Payer: Self-pay | Admitting: Obstetrics and Gynecology

## 2016-08-23 VITALS — BP 104/70 | HR 66 | Resp 20 | Ht 62.0 in | Wt 162.2 lb

## 2016-08-23 DIAGNOSIS — R7309 Other abnormal glucose: Secondary | ICD-10-CM | POA: Diagnosis not present

## 2016-08-23 DIAGNOSIS — Z01419 Encounter for gynecological examination (general) (routine) without abnormal findings: Secondary | ICD-10-CM

## 2016-08-23 DIAGNOSIS — N941 Unspecified dyspareunia: Secondary | ICD-10-CM | POA: Diagnosis not present

## 2016-08-23 LAB — COMPREHENSIVE METABOLIC PANEL
ALT: 11 U/L (ref 6–29)
AST: 13 U/L (ref 10–35)
Albumin: 4.5 g/dL (ref 3.6–5.1)
Alkaline Phosphatase: 77 U/L (ref 33–130)
BILIRUBIN TOTAL: 0.6 mg/dL (ref 0.2–1.2)
BUN: 16 mg/dL (ref 7–25)
CALCIUM: 9.8 mg/dL (ref 8.6–10.4)
CHLORIDE: 101 mmol/L (ref 98–110)
CO2: 25 mmol/L (ref 20–31)
Creat: 1.1 mg/dL — ABNORMAL HIGH (ref 0.50–0.99)
GLUCOSE: 100 mg/dL — AB (ref 65–99)
Potassium: 4.3 mmol/L (ref 3.5–5.3)
SODIUM: 136 mmol/L (ref 135–146)
Total Protein: 7.3 g/dL (ref 6.1–8.1)

## 2016-08-23 LAB — CBC
HCT: 43.8 % (ref 35.0–45.0)
Hemoglobin: 14.5 g/dL (ref 11.7–15.5)
MCH: 30.1 pg (ref 27.0–33.0)
MCHC: 33.1 g/dL (ref 32.0–36.0)
MCV: 90.9 fL (ref 80.0–100.0)
MPV: 9.2 fL (ref 7.5–12.5)
Platelets: 338 K/uL (ref 140–400)
RBC: 4.82 MIL/uL (ref 3.80–5.10)
RDW: 13.3 % (ref 11.0–15.0)
WBC: 4.8 K/uL (ref 3.8–10.8)

## 2016-08-23 LAB — TSH: TSH: 3.19 mIU/L

## 2016-08-23 LAB — LIPID PANEL
CHOL/HDL RATIO: 2.6 ratio (ref <5.0)
Cholesterol: 200 mg/dL — ABNORMAL HIGH (ref <200)
HDL: 78 mg/dL (ref >50)
LDL CALC: 106 mg/dL — AB (ref <100)
Triglycerides: 80 mg/dL (ref <150)
VLDL: 16 mg/dL (ref <30)

## 2016-08-23 MED ORDER — ESTERIFIED ESTROGENS 0.3 MG PO TABS: ORAL_TABLET | ORAL | 3 refills | Status: DC

## 2016-08-23 MED ORDER — ESTROGENS, CONJUGATED 0.625 MG/GM VA CREA: TOPICAL_CREAM | VAGINAL | 3 refills | Status: DC

## 2016-08-23 NOTE — Patient Instructions (Signed)

## 2016-08-24 LAB — HEMOGLOBIN A1C
HEMOGLOBIN A1C: 5.5 % (ref <5.7)
Mean Plasma Glucose: 111 mg/dL

## 2016-08-24 LAB — VITAMIN D 25 HYDROXY (VIT D DEFICIENCY, FRACTURES): VIT D 25 HYDROXY: 45 ng/mL (ref 30–100)

## 2016-12-31 ENCOUNTER — Other Ambulatory Visit: Payer: Self-pay | Admitting: *Deleted

## 2016-12-31 MED ORDER — VALACYCLOVIR HCL 1 G PO TABS: 1000.0000 mg | ORAL_TABLET | Freq: Every day | ORAL | 3 refills | Status: DC

## 2016-12-31 NOTE — Telephone Encounter (Signed)
Medication refill request: Valtrex Last AEX:  08-23-16  Next AEX: 09-04-17  Last MMG (if hormonal medication request): 04-16-16 WNL Refill authorized: please advise

## 2017-03-12 ENCOUNTER — Other Ambulatory Visit: Payer: Self-pay | Admitting: Nurse Practitioner

## 2017-03-12 DIAGNOSIS — Z1231 Encounter for screening mammogram for malignant neoplasm of breast: Secondary | ICD-10-CM

## 2017-04-17 ENCOUNTER — Ambulatory Visit
Admission: RE | Admit: 2017-04-17 | Discharge: 2017-04-17 | Disposition: A | Discharge status: Home / Self Care | Payer: Commercial Managed Care - HMO | Source: Ambulatory Visit | Attending: Nurse Practitioner | Admitting: Nurse Practitioner

## 2017-04-17 DIAGNOSIS — Z1231 Encounter for screening mammogram for malignant neoplasm of breast: Secondary | ICD-10-CM

## 2017-09-04 ENCOUNTER — Ambulatory Visit: Payer: 59 | Admitting: Obstetrics and Gynecology

## 2017-09-04 ENCOUNTER — Other Ambulatory Visit: Payer: Self-pay

## 2017-09-04 ENCOUNTER — Encounter: Payer: Self-pay | Admitting: Obstetrics and Gynecology

## 2017-09-04 VITALS — BP 140/80 | HR 76 | Resp 14 | Ht 62.0 in | Wt 171.0 lb

## 2017-09-04 DIAGNOSIS — N766 Ulceration of vulva: Secondary | ICD-10-CM

## 2017-09-04 DIAGNOSIS — Z01419 Encounter for gynecological examination (general) (routine) without abnormal findings: Secondary | ICD-10-CM

## 2017-09-04 DIAGNOSIS — N941 Unspecified dyspareunia: Secondary | ICD-10-CM

## 2017-09-04 MED ORDER — ESTERIFIED ESTROGENS 0.3 MG PO TABS: ORAL_TABLET | ORAL | 0 refills | Status: AC

## 2017-09-04 MED ORDER — VALACYCLOVIR HCL 1 G PO TABS: 1000.0000 mg | ORAL_TABLET | Freq: Every day | ORAL | 3 refills | Status: DC

## 2017-09-04 MED ORDER — ESTROGENS, CONJUGATED 0.625 MG/GM VA CREA: TOPICAL_CREAM | VAGINAL | 2 refills | Status: AC

## 2017-09-04 NOTE — Progress Notes (Signed)
62 y.o. G27P0003 Divorced [place] American female here for annual exam.    On oral estrogen, but weaning off.  Taking every other day.  No night sweats.   Notes pain with intercourse with deep penetration since her hysterectomy. Using Premarin cream irregularly.  Normal pelvic US in 2015.   No vaginal bleeding.  No problems with bladder or bowel function.   No regular outbreaks of HSV. Dx at urgent care.  Really wants to have a confirmatory diagnosis.   States family has Bechet's.  She did not see her PCP last year after her creatinine was slightly elevated.  PCP: Kelton Pillar     Patient's last menstrual period was 01/13/2000 (approximate).           Sexually active: Yes.    The current method of family planning is tubal ligation and status post hysterectomy.    Exercising: Yes.    walking Smoker:  no  Health Maintenance: Pap:  08-22-15 Neg:Neg HR HPV History of abnormal Pap:  no MMG:  04/17/17 BIRADS 1 negative/density b Colonoscopy:  03-13-10 normal;next due 02/2020 BMD: 04/16/16  Result: Normal TDaP:  2010 HIV and Hep C: 08/22/15 Negative Screening Labs: fasting labs today   reports that she has never smoked. She has never used smokeless tobacco. She reports that she drinks about 0.5 oz of alcohol per week. She reports that she does not use drugs.  Past Medical History:  Diagnosis Date  . Anemia   . Elevated hemoglobin A1c   . Enlarged uterus    c/w fibroids  . HSV-2 (herpes simplex virus 2) infection     Past Surgical History:  Procedure Laterality Date  . ABDOMINAL SURGERY  07/2016   liposuction  . FOOT SURGERY  1996   bilateral nerve injection  . laparoscopic BSO  09/13/08    lysis of adhesions-done secondary dysparenia post TVH-tubal dysplasia/CIS  . PELVIC LAPAROSCOPY  09-13-08   BSO/lysis of adhesions secondary to dyspareunia  . TUBAL LIGATION  1980  . VAGINAL HYSTERECTOMY  9/01    Current Outpatient Medications  Medication Sig Dispense Refill  .  clobetasol (TEMOVATE) 0.05 % external solution Apply 1 application topically daily.    . Esterified Estrogens (MENEST) 0.3 MG tablet Take one tablet (0.3 mg) by mouth daily. 90 tablet 3  . valACYclovir (VALTREX) 1000 MG tablet Take 1 tablet (1,000 mg total) by mouth daily. 90 tablet 3   No current facility-administered medications for this visit.     Family History  Problem Relation Age of Onset  . Cervical cancer Mother   . Colon cancer Brother 53  . Other Sister        x2 Behcet's syndrome  . Other Sister        Behcet's Syndrome    Review of Systems  Genitourinary:       Pain with intercourse   All other systems reviewed and are negative.   Exam:   BP 140/80 (BP Location: Right Arm, Patient Position: Sitting, Cuff Size: Large)   Pulse 76   Resp 14   Ht 5\' 2"  (1.575 m)   Wt 171 lb (77.6 kg)   LMP 01/13/2000 (Approximate)   BMI 31.28 kg/m     General appearance: alert, cooperative and appears stated age Head: Normocephalic, without obvious abnormality, atraumatic Neck: no adenopathy, supple, symmetrical, trachea midline and thyroid normal to inspection and palpation Lungs: clear to auscultation bilaterally Breasts: normal appearance, no masses or tenderness, No nipple retraction or dimpling, No nipple  discharge or bleeding, No axillary or supraclavicular adenopathy Heart: regular rate and rhythm Abdomen: soft, non-tender; no masses, no organomegaly Extremities: extremities normal, atraumatic, no cyanosis or edema Skin: Skin color, texture, turgor normal. No rashes or lesions Lymph nodes: Cervical, supraclavicular, and axillary nodes normal. No abnormal inguinal nodes palpated Neurologic: Grossly normal  Pelvic: External genitalia:  no lesions              Urethra:  normal appearing urethra with no masses, tenderness or lesions              Bartholins and Skenes: normal                 Vagina: normal appearing vagina with normal color and discharge, no lesions               Cervix: absent              Pap taken: No. Bimanual Exam:  Uterus:   absent              Adnexa: no mass, fullness, tenderness              Rectal exam: Yes.  .  Confirms.              Anus:  normal sphincter tone, no lesions  Chaperone was present for exam.  Assessment:   Well woman visit with normal exam. Status post TVH. 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 Dyspareunia. Chronic.  Normal pelvic ultrasound.  Atrophy suspected.  Could have adhesive disease.  Hx elevated HgbA1C.  Hx slightly elevated Cr. Hx HSV 2.   Plan: Mammogram screening. Recommended self breast awareness. Pap and HR HPV as above. Guidelines for Calcium, Vitamin D, regular exercise program including cardiovascular and weight bearing exercise. Will check HSV 1 and 2 IgG. She will see Dr. Laurann Montana for a check up and lab work.   Refill of Valtrex, oral estrogen, and vaginal estrogen.  She will wean off her oral estrogen and use 1/2 tablet daily for the next 3 months, and then try to stop.  We discussed estrogen use and increased risk of stroke, PE, DVT, and possible breast cancer. I recommend consistent use of the vaginal estrogen.  Return in 3 months for a recheck.  If dyspareunia persists, will repeat pelvic US and consider laparoscopy.   Follow up annually and prn.   After visit summary provided.

## 2017-09-05 ENCOUNTER — Encounter: Payer: Self-pay | Admitting: Obstetrics and Gynecology

## 2017-09-05 LAB — HSV(HERPES SIMPLEX VRS) I + II AB-IGG
HSV 1 GLYCOPROTEIN G AB, IGG: 32 {index} — AB (ref 0.00–0.90)
HSV 2 IgG, Type Spec: 8.17 index — ABNORMAL HIGH (ref 0.00–0.90)

## 2017-10-22 DIAGNOSIS — L810 Postinflammatory hyperpigmentation: Secondary | ICD-10-CM | POA: Diagnosis not present

## 2017-10-22 DIAGNOSIS — D229 Melanocytic nevi, unspecified: Secondary | ICD-10-CM | POA: Diagnosis not present

## 2017-10-22 DIAGNOSIS — L669 Cicatricial alopecia, unspecified: Secondary | ICD-10-CM | POA: Diagnosis not present

## 2017-12-04 ENCOUNTER — Telehealth: Payer: Self-pay | Admitting: Obstetrics and Gynecology

## 2017-12-04 ENCOUNTER — Encounter: Payer: Self-pay | Admitting: Obstetrics and Gynecology

## 2017-12-04 ENCOUNTER — Ambulatory Visit (INDEPENDENT_AMBULATORY_CARE_PROVIDER_SITE_OTHER): Payer: 59 | Admitting: Obstetrics and Gynecology

## 2017-12-04 ENCOUNTER — Other Ambulatory Visit: Payer: Self-pay

## 2017-12-04 VITALS — BP 142/76 | HR 80 | Resp 16 | Ht 62.0 in | Wt 169.0 lb

## 2017-12-04 DIAGNOSIS — N393 Stress incontinence (female) (male): Secondary | ICD-10-CM

## 2017-12-04 DIAGNOSIS — N941 Unspecified dyspareunia: Secondary | ICD-10-CM | POA: Diagnosis not present

## 2017-12-04 DIAGNOSIS — N8110 Cystocele, unspecified: Secondary | ICD-10-CM | POA: Diagnosis not present

## 2017-12-04 DIAGNOSIS — N816 Rectocele: Secondary | ICD-10-CM | POA: Diagnosis not present

## 2017-12-04 NOTE — Telephone Encounter (Signed)
Call placed to patient to review benefits and scheduled recommended ultrasound. Left voicemail requesting a return call.

## 2017-12-04 NOTE — Progress Notes (Signed)
GYNECOLOGY  VISIT   HPI: 62 y.o.   Divorced  [place] American  female   [phone] with Patient's last menstrual period was 01/13/2000 (approximate).   here for 3 month recheck    Still with pain with intercourse but also urgency to empty her bladder when this occurs.  Pain with intercourse since 2001, after she had her hysterectomy.  No pelvic pain otherwise.  Does not have dysuria. Does leak if she sneezes.  Denies constipation problems or difficulty passing BMs. Using vaginal cream three times a week.  Feels like something is hitting a brick wall with intercourse. Position changes do not make a difference.  No pain with entry, it is with deep penetration.  Using coconut oil.  Lubrication is not an issue.  GYNECOLOGIC HISTORY: Patient's last menstrual period was 01/13/2000 (approximate). Contraception:  Hysterectomy Menopausal hormone therapy:  Premarin Last mammogram:  04/17/17 BIRADS 1 negative/density b Last pap smear:   08-22-15 Neg:Neg HR HPV        OB History    Gravida  3   Para  3   Term  0   Preterm  0   AB  0   Living  3     SAB  0   TAB  0   Ectopic  0   Multiple  0   Live Births  3              Patient Active Problem List   Diagnosis Date Noted  . HSV-2 (herpes simplex virus 2) infection 05/14/2012    Past Medical History:  Diagnosis Date  . Anemia   . Elevated hemoglobin A1c   . Enlarged uterus    c/w fibroids  . HSV-1 (herpes simplex virus 1) infection   . HSV-2 (herpes simplex virus 2) infection     Past Surgical History:  Procedure Laterality Date  . ABDOMINAL SURGERY  07/2016   liposuction  . FOOT SURGERY  1996   bilateral nerve injection  . laparoscopic BSO  09/13/08    lysis of adhesions-done secondary dysparenia post TVH-tubal dysplasia/CIS  . PELVIC LAPAROSCOPY  09-13-08   BSO/lysis of adhesions secondary to dyspareunia  . TUBAL LIGATION  1980  . VAGINAL HYSTERECTOMY  9/01    Current Outpatient Medications  Medication  Sig Dispense Refill  . clobetasol (TEMOVATE) 0.05 % external solution Apply 1 application topically daily.    Marland Kitchen conjugated estrogens (PREMARIN) vaginal cream Use 1/2 g vaginally every night at bed time for the first 2 weeks, then use 1/2 g vaginally two or three times per week. 30 g 2  . Esterified Estrogens (MENEST) 0.3 MG tablet Take one tablet (0.3 mg) by mouth daily. 90 tablet 0  . valACYclovir (VALTREX) 1000 MG tablet Take 1 tablet (1,000 mg total) by mouth daily. 90 tablet 3   No current facility-administered medications for this visit.      ALLERGIES: Codeine  Family History  Problem Relation Age of Onset  . Cervical cancer Mother   . Colon cancer Brother 14  . Other Sister        x2 Behcet's syndrome  . Other Sister        Behcet's Syndrome    Social History   Socioeconomic History  . Marital status: Divorced    Spouse name: Not on file  . Number of children: Not on file  . Years of education: Not on file  . Highest education level: Not on file  Occupational History  . Not  on file  Social Needs  . Financial resource strain: Not on file  . Food insecurity:    Worry: Not on file    Inability: Not on file  . Transportation needs:    Medical: Not on file    Non-medical: Not on file  Tobacco Use  . Smoking status: Never Smoker  . Smokeless tobacco: Never Used  Substance and Sexual Activity  . Alcohol use: Yes    Alcohol/week: 0.6 oz    Types: 1 Standard drinks or equivalent per week    Comment: occ  . Drug use: No  . Sexual activity: Yes    Birth control/protection: None, Surgical    Comment: TVH  Lifestyle  . Physical activity:    Days per week: Not on file    Minutes per session: Not on file  . Stress: Not on file  Relationships  . Social connections:    Talks on phone: Not on file    Gets together: Not on file    Attends religious service: Not on file    Active member of club or organization: Not on file    Attends meetings of clubs or  organizations: Not on file    Relationship status: Not on file  . Intimate partner violence:    Fear of current or ex partner: Not on file    Emotionally abused: Not on file    Physically abused: Not on file    Forced sexual activity: Not on file  Other Topics Concern  . Not on file  Social History Narrative  . Not on file    Review of Systems  Constitutional: Negative.   HENT: Negative.   Eyes: Negative.   Respiratory: Negative.   Cardiovascular: Negative.   Gastrointestinal: Negative.   Endocrine: Negative.   Genitourinary:       Pain with intercourse  Musculoskeletal: Negative.   Skin: Negative.   Allergic/Immunologic: Negative.   Neurological: Negative.   Hematological: Negative.   Psychiatric/Behavioral: Negative.     PHYSICAL EXAMINATION:    BP (!) 142/76 (BP Location: Right Arm, Patient Position: Sitting, Cuff Size: Normal)   Pulse 80   Resp 16   Ht 5\' 2"  (1.575 m)   Wt 169 lb (76.7 kg)   LMP 01/13/2000 (Approximate)   BMI 30.91 kg/m     General appearance: alert, cooperative and appears stated age   Pelvic: External genitalia:  no lesions              Urethra:  normal appearing urethra with no masses, tenderness or lesions              Bartholins and Skenes: normal                 Vagina: normal appearing vagina with normal color and discharge, no lesions.  Second degree cystocele and first degree rectocele.               Cervix:  absent                Bimanual Exam:  Uterus:  absent              Adnexa: no mass, fullness, tenderness              Rectal exam: Yes.  .  Confirms.              Anus:  normal sphincter tone, no lesions  Chaperone was present for exam.  ASSESSMENT Status post TVH.  Status  post laparoscopic BSO for dyspareunia following hysterectomy.  CIS of fallopian tube detected.  Second degree cystocele.  First degree rectocele. Deep dyspareunia.  Chronic.   PLAN  Nuswab.  Pelvic US.  Information on dyspareunia, prolapse, and  urinary incontinence.  Continue vaginal estrogen cream.  We have discussed potential laparoscopy and cystocele repair.  Patient may also be a candidate for a midurethral sling for stress incontinence.  An After Visit Summary was printed and given to the patient.  __25____ minutes face to face time of which over 50% was spent in counseling.

## 2017-12-04 NOTE — Patient Instructions (Signed)
Dyspareunia, Female Dyspareunia is pain that is associated with sexual activity. This can affect any part of the genitals or lower abdomen, and there are many possible causes. This condition ranges from mild to severe. Depending on the cause, dyspareunia may get better with treatment, or it may return (recur) over time. What are the causes? The cause of this condition is not always known. Possible causes include:  Cancer.  Psychological factors, such as depression, anxiety, or previous traumatic experiences.  Severe pain and tenderness of the skin around the vagina (vulva) when it is touched (vulvar vestibulitis syndrome).  Infection of the pelvis or the vulva.  Infection of the vagina.  Painful, involuntary tightening (contraction) of the vaginal muscles when anything is put inside the vagina (vaginismus).  Allergic reaction.  Ovarian cysts.  Solid growths of tissue (tumors) in the ovaries or the uterus.  Scar tissue in the ovaries, vagina, or pelvis.  Vaginal dryness.  Thinning of the tissue (atrophy) of the vulva and vagina.  Skin conditions that affect the vulva (vulvar dermatoses), such as lichen sclerosus or lichen planus.  Endometriosis.  Tubal pregnancy.  A tilted uterus.  Uterine prolapse.  Adhesions in the vagina.  Bladder problems.  Intestinal problems.  Certain medicines.  Medical conditions such as diabetes, arthritis, or thyroid disease.  What increases the risk? The following factors may make you more likely to develop this condition:  Having experienced physical or sexual trauma.  Having given birth more than once.  Taking birth control pills.  Having gone through menopause.  Having recently given birth, typically within the past 3-6 months.  Breastfeeding.  What are the signs or symptoms? The main symptom of this condition is pain in any part of the genitals or lower abdomen during or after sexual activity. This may include pain  during sexual arousal, genital stimulation, or orgasm. Pain may get worse when anything is inserted into the vagina, or when the genitals are touched in any way, such as when sitting or wearing pants. Pain can range from mild to severe, depending on the cause of the condition. In some cases, symptoms go away with treatment and return (recur) at a later date. How is this diagnosed? This condition may be diagnosed based on:  Your symptoms, including: ? Where your pain is located. ? When your pain occurs.  Your medical history.  A physical exam. This may include a pelvic exam and a Pap test. This is a screening test that is used to check for signs of cancer of the vagina, cervix, and uterus.  Tests, including: ? Blood tests. ? Ultrasound. This uses sound waves to make a picture of the area that is being tested. ? Urine culture. This test involves checking a urine sample for signs of infection. ? Culture test. This is when your health care provider uses a swab to collect a sample of vaginal fluid. The sample is checked for signs of infection. ? X-rays. ? MRI. ? CT scan. ? Laparoscopy. This is a procedure in which a small incision is made in your lower abdomen and a lighted, pencil-sized instrument (laparoscope) is passed through the incision and used to look inside your pelvis.  You may be referred to a health care provider who specializes in women's health (gynecologist). In some cases, diagnosing the cause of dyspareunia can be difficult. How is this treated? Treatment depends on the cause of your condition and your symptoms. In most cases, you may need to stop sexual activity until your symptoms   improve. Treatment may include:  Lubricants.  Kegel exercises or vaginal dilators.  Medicated skin creams.  Medicated vaginal creams.  Hormonal therapy.  Antibiotic medicine to prevent or fight infection.  Medicines that help to relieve pain.  Medicines that treat depression  (antidepressants).  Psychological counseling.  Sex therapy.  Surgery.  Follow these instructions at home: Lifestyle  Avoid tight clothing and irritating materials around your genital and abdominal area.  Use water-based lubricants as needed. Avoid oil-based lubricants.  Do not use any products that irritate you. This may include certain condoms, spermicides, lubricants, soaps, tampons, vaginal sprays, or douches.  Always practice safe sex. Talk with your health care provider about which form of birth control (contraception) is best for you.  Maintain open communication with your sexual partner. General instructions  Take over-the-counter and prescription medicines only as told by your health care provider.  If you had tests done, it is your responsibility to get your tests results. Ask your health care provider or the department performing the test when your results will be ready.  Urinate before you engage in sexual activity.  Consider joining a support group.  Keep all follow-up visits as told by your health care provider. This is important. Contact a health care provider if:  You develop vaginal bleeding after sexual intercourse.  You develop a lump at the opening of your vagina. Seek medical care even if the lump is painless.  You have: ? Abnormal vaginal discharge. ? Vaginal dryness. ? Itchiness or irritation of your vulva or vagina. ? A new rash. ? Symptoms that get worse or do not improve with treatment. ? A fever. ? Pain when you urinate. ? Blood in your urine. Get help right away if:  You develop severe pain in your abdomen during or shortly after sexual intercourse.  You pass out after having sexual intercourse. This information is not intended to replace advice given to you by your health care provider. Make sure you discuss any questions you have with your health care provider. Document Released: 05/20/2007 Document Revised: 09/09/2015 Document  Reviewed: 11/30/2014 Elsevier Interactive Patient Education  2018 [employer]  

## 2017-12-05 DIAGNOSIS — N959 Unspecified menopausal and perimenopausal disorder: Secondary | ICD-10-CM | POA: Diagnosis not present

## 2017-12-05 DIAGNOSIS — Z131 Encounter for screening for diabetes mellitus: Secondary | ICD-10-CM | POA: Diagnosis not present

## 2017-12-05 DIAGNOSIS — Z13220 Encounter for screening for lipoid disorders: Secondary | ICD-10-CM | POA: Diagnosis not present

## 2017-12-05 DIAGNOSIS — N8110 Cystocele, unspecified: Secondary | ICD-10-CM | POA: Insufficient documentation

## 2017-12-05 DIAGNOSIS — N941 Unspecified dyspareunia: Secondary | ICD-10-CM | POA: Insufficient documentation

## 2017-12-05 DIAGNOSIS — N393 Stress incontinence (female) (male): Secondary | ICD-10-CM | POA: Insufficient documentation

## 2017-12-05 DIAGNOSIS — N816 Rectocele: Secondary | ICD-10-CM | POA: Insufficient documentation

## 2017-12-05 NOTE — Telephone Encounter (Signed)
Second call placed to patient to schedule and review benefits for recommended ultrasound. Left voicemail message requesting a return call

## 2017-12-05 NOTE — Telephone Encounter (Signed)
Patient returned call. Spoke with patient regarding benefit for an ultrasound. Patient understood and agreeable. Patient ready to schedule. Patient scheduled 12/19/17 with Dr Quincy Simmonds. Patient aware of appointment date, arrival time and cancellation policy. No further questions. Ok to close

## 2017-12-05 NOTE — Telephone Encounter (Signed)
Patient left voicemail returning call to [facility].

## 2017-12-08 LAB — NUSWAB VAGINITIS (VG)
CANDIDA ALBICANS, NAA: POSITIVE — AB
Candida glabrata, NAA: POSITIVE — AB
Trich vag by NAA: NEGATIVE

## 2017-12-10 ENCOUNTER — Telehealth: Payer: Self-pay | Admitting: Emergency Medicine

## 2017-12-10 MED ORDER — FLUCONAZOLE 150 MG PO TABS: 150.0000 mg | ORAL_TABLET | Freq: Once | ORAL | 0 refills | Status: AC

## 2017-12-10 NOTE — Telephone Encounter (Signed)
Message left to return call to [place] at [phone].   Rx for Diflucan 150 mg po sent to [facility] [address].   Keep appointment for 12/19/17 ultrasound per Dr. Quincy Simmonds.

## 2017-12-10 NOTE — Telephone Encounter (Signed)
-----   Message from Nunzio Cobbs, MD sent at 12/08/2017  7:05 PM EDT ----- Please report results to patient showing yeast vaginitis.  She has 2 types of yeast.  I am recommending Diflucan 150 mg orally x 1.  May repeat in 72 hours if needed.   I do recommend retesting when she returns for her pelvic ultrasound appointment and pushing that appointment forward by one week.  I will need to do the testing on the same day as the ultrasound, prior to her having the ultrasound done.(Please make a note in the scheduling.) The candida glabrata may need to be treated with vaginal boric acid suppositories if it does not resolve.

## 2017-12-12 NOTE — Telephone Encounter (Signed)
Call again to home number and voicemail left.  Called mobile, not listed on designated party release form. Did not leave message.

## 2017-12-13 NOTE — Telephone Encounter (Signed)
Call to patient on mobile. Results discussed and she will pick up Diflucan and start today. Advised will need recheck prior to ultrasound scheduled for 12/19/17. Verbalized understanding.  Encounter closed.

## 2017-12-17 ENCOUNTER — Other Ambulatory Visit: Payer: Self-pay | Admitting: Obstetrics and Gynecology

## 2017-12-17 NOTE — Telephone Encounter (Signed)
Spoke with patient. Patient states that she is taking 1/2 tablet daily of menest, but does not need a refill at this time. Advised would update pharmacy.   Encounter closed.

## 2017-12-19 ENCOUNTER — Encounter: Payer: Self-pay | Admitting: Obstetrics and Gynecology

## 2017-12-19 ENCOUNTER — Ambulatory Visit (INDEPENDENT_AMBULATORY_CARE_PROVIDER_SITE_OTHER): Payer: 59 | Admitting: Obstetrics and Gynecology

## 2017-12-19 ENCOUNTER — Ambulatory Visit (INDEPENDENT_AMBULATORY_CARE_PROVIDER_SITE_OTHER): Payer: 59

## 2017-12-19 ENCOUNTER — Other Ambulatory Visit: Payer: Self-pay

## 2017-12-19 VITALS — BP 148/72 | HR 70 | Resp 14 | Ht 62.0 in | Wt 169.0 lb

## 2017-12-19 DIAGNOSIS — N941 Unspecified dyspareunia: Secondary | ICD-10-CM

## 2017-12-19 NOTE — Progress Notes (Signed)
GYNECOLOGY  VISIT   HPI: 62 y.o.   Divorced  [place]  female   [phone] with Patient's last menstrual period was 01/13/2000 (approximate).   here for ultrasound follow up and vaginitis retesting.   Has dyspareunia since after her hysterectomy.  This lead to her BSO which ultimately lead to the dx of CIS of the fallopian tube.   We did detect a cystocele and small rectocele at her office visit on 12/04/17. No urinary incontinence.   Dx of candida albicans and glabrata.  Tx with Diflucan, took 2, last one was 2 days ago.   Hx HSV 1 and 2.   Does use vaginal estrogen cream since her check up in April, 2019.  Now using three days a week.  Does not feel like this is helping the deep thrusting pain.   GYNECOLOGIC HISTORY: Patient's last menstrual period was 01/13/2000 (approximate). Contraception:  Hysterectomy Menopausal hormone therapy:  Estrogen cream Last mammogram:  04/17/2017 BIRADS 1:negative/density by Last pap smear:   08/22/2015 neg, neg HR HPV        OB History    Gravida  3   Para  3   Term  0   Preterm  0   AB  0   Living  3     SAB  0   TAB  0   Ectopic  0   Multiple  0   Live Births  3              Patient Active Problem List   Diagnosis Date Noted  . Female bladder prolapse 12/05/2017  . Rectocele 12/05/2017  . Stress incontinence 12/05/2017  . Dyspareunia in female 12/05/2017  . HSV-2 (herpes simplex virus 2) infection 05/14/2012    Past Medical History:  Diagnosis Date  . Anemia   . Elevated hemoglobin A1c   . Enlarged uterus    c/w fibroids  . HSV-1 (herpes simplex virus 1) infection   . HSV-2 (herpes simplex virus 2) infection     Past Surgical History:  Procedure Laterality Date  . ABDOMINAL SURGERY  07/2016   liposuction  . FOOT SURGERY  1996   bilateral nerve injection  . laparoscopic BSO  09/13/08    lysis of adhesions-done secondary dysparenia post TVH-tubal dysplasia/CIS  . PELVIC LAPAROSCOPY  09-13-08    BSO/lysis of adhesions secondary to dyspareunia  . TUBAL LIGATION  1980  . VAGINAL HYSTERECTOMY  9/01    Current Outpatient Medications  Medication Sig Dispense Refill  . clobetasol (TEMOVATE) 0.05 % external solution Apply 1 application topically daily.    Marland Kitchen conjugated estrogens (PREMARIN) vaginal cream Use 1/2 g vaginally every night at bed time for the first 2 weeks, then use 1/2 g vaginally two or three times per week. 30 g 2  . Esterified Estrogens (MENEST) 0.3 MG tablet Take one tablet (0.3 mg) by mouth daily. 90 tablet 0  . valACYclovir (VALTREX) 1000 MG tablet Take 1 tablet (1,000 mg total) by mouth daily. 90 tablet 3   No current facility-administered medications for this visit.      ALLERGIES: Codeine  Family History  Problem Relation Age of Onset  . Cervical cancer Mother   . Colon cancer Brother 7  . Other Sister        x2 Behcet's syndrome  . Other Sister        Behcet's Syndrome    Social History   Socioeconomic History  . Marital status: Divorced    Spouse  name: Not on file  . Number of children: Not on file  . Years of education: Not on file  . Highest education level: Not on file  Occupational History  . Not on file  Social Needs  . Financial resource strain: Not on file  . Food insecurity:    Worry: Not on file    Inability: Not on file  . Transportation needs:    Medical: Not on file    Non-medical: Not on file  Tobacco Use  . Smoking status: Never Smoker  . Smokeless tobacco: Never Used  Substance and Sexual Activity  . Alcohol use: Yes    Alcohol/week: 1.0 standard drinks    Types: 1 Standard drinks or equivalent per week    Comment: occ  . Drug use: No  . Sexual activity: Yes    Birth control/protection: None, Surgical    Comment: TVH  Lifestyle  . Physical activity:    Days per week: Not on file    Minutes per session: Not on file  . Stress: Not on file  Relationships  . Social connections:    Talks on phone: Not on file    Gets  together: Not on file    Attends religious service: Not on file    Active member of club or organization: Not on file    Attends meetings of clubs or organizations: Not on file    Relationship status: Not on file  . Intimate partner violence:    Fear of current or ex partner: Not on file    Emotionally abused: Not on file    Physically abused: Not on file    Forced sexual activity: Not on file  Other Topics Concern  . Not on file  Social History Narrative  . Not on file    Review of Systems  Constitutional: Negative.   HENT: Negative.   Eyes: Negative.   Respiratory: Negative.   Cardiovascular: Negative.   Gastrointestinal: Negative.   Endocrine: Negative.   Genitourinary:       Pain with intercourse  Musculoskeletal: Negative.   Skin: Negative.   Allergic/Immunologic: Negative.   Neurological: Negative.   Hematological: Negative.   Psychiatric/Behavioral: Negative.   All other systems reviewed and are negative.   PHYSICAL EXAMINATION:    BP (!) 148/72 (BP Location: Left Arm, Patient Position: Sitting)   Pulse 70   Resp 14   Ht 5\' 2"  (1.575 m)   Wt 169 lb (76.7 kg)   LMP 01/13/2000 (Approximate)   BMI 30.91 kg/m     General appearance: alert, cooperative and appears stated age  Pelvic: External genitalia:  no lesions              Urethra:  normal appearing urethra with no masses, tenderness or lesions              Bartholins and Skenes: normal                 Vagina: normal appearing vagina with normal color and discharge, no lesions.  White cream noted.               Cervix:  Tender cuff, small 3 mm nodule that is tender but also tender to the left of this.   Bimanual Exam:  Uterus:  normal size, contour, position, consistency, mobility, non-tender              Adnexa: no mass, fullness, tenderness        Chaperone was present  for exam.  ASSESSMENT  Dyspareunia.  Chronic.  Status post hysterectomy.  Status post BSO.  Cystocele and rectocele.  I do not  think these are contributing to this.  Candida Glabrata.   PLAN  Unable to do Nuswab today.  Return next week for vaginitis retesting.  No vaginal products between then and now.   If Standley Dakins is present, will do vaginal boric acid treatments.  Consider referral to pelvic pain clinic if pain persists.  I am not recommending laparoscopy at this time.  Patient indicates understanding.   An After Visit Summary was printed and given to the patient.  __15____ minutes face to face time of which over 50% was spent in counseling.

## 2017-12-19 NOTE — Progress Notes (Signed)
Encounter reviewed by Dr. Cheril Slattery Amundson C. Silva.  

## 2017-12-19 NOTE — Progress Notes (Signed)
GYNECOLOGY  VISIT   HPI: 62 y.o.   Divorced  [place]  female   [phone] with Patient's last menstrual period was 01/13/2000 (approximate).   here for restest for yeast infection.     Itching has resolved.   GYNECOLOGIC HISTORY: Patient's last menstrual period was 01/13/2000 (approximate). Contraception:  hysterectomy Menopausal hormone therapy:  Estrogen cream Last mammogram:04/17/2017 BIRADS 1:negative/density  Last pap smear:  08/22/2015 neg, neg HR HPV         OB History    Gravida  3   Para  3   Term  0   Preterm  0   AB  0   Living  3     SAB  0   TAB  0   Ectopic  0   Multiple  0   Live Births  3              Patient Active Problem List   Diagnosis Date Noted  . Female bladder prolapse 12/05/2017  . Rectocele 12/05/2017  . Stress incontinence 12/05/2017  . Dyspareunia in female 12/05/2017  . HSV-2 (herpes simplex virus 2) infection 05/14/2012    Past Medical History:  Diagnosis Date  . Anemia   . Elevated hemoglobin A1c   . Enlarged uterus    c/w fibroids  . HSV-1 (herpes simplex virus 1) infection   . HSV-2 (herpes simplex virus 2) infection     Past Surgical History:  Procedure Laterality Date  . ABDOMINAL SURGERY  07/2016   liposuction  . FOOT SURGERY  1996   bilateral nerve injection  . laparoscopic BSO  09/13/08    lysis of adhesions-done secondary dysparenia post TVH-tubal dysplasia/CIS  . PELVIC LAPAROSCOPY  09-13-08   BSO/lysis of adhesions secondary to dyspareunia  . TUBAL LIGATION  1980  . VAGINAL HYSTERECTOMY  9/01    Current Outpatient Medications  Medication Sig Dispense Refill  . clobetasol (TEMOVATE) 0.05 % external solution Apply 1 application topically daily.    Marland Kitchen conjugated estrogens (PREMARIN) vaginal cream Use 1/2 g vaginally every night at bed time for the first 2 weeks, then use 1/2 g vaginally two or three times per week. 30 g 2  . Esterified Estrogens (MENEST) 0.3 MG tablet Take one tablet (0.3 mg) by  mouth daily. 90 tablet 0  . valACYclovir (VALTREX) 1000 MG tablet Take 1 tablet (1,000 mg total) by mouth daily. 90 tablet 3   No current facility-administered medications for this visit.      ALLERGIES: Codeine  Family History  Problem Relation Age of Onset  . Cervical cancer Mother   . Colon cancer Brother 83  . Other Sister        x2 Behcet's syndrome  . Other Sister        Behcet's Syndrome    Social History   Socioeconomic History  . Marital status: Divorced    Spouse name: Not on file  . Number of children: Not on file  . Years of education: Not on file  . Highest education level: Not on file  Occupational History  . Not on file  Social Needs  . Financial resource strain: Not on file  . Food insecurity:    Worry: Not on file    Inability: Not on file  . Transportation needs:    Medical: Not on file    Non-medical: Not on file  Tobacco Use  . Smoking status: Never Smoker  . Smokeless tobacco: Never Used  Substance and Sexual  Activity  . Alcohol use: Yes    Alcohol/week: 1.0 standard drinks    Types: 1 Standard drinks or equivalent per week    Comment: occ  . Drug use: No  . Sexual activity: Yes    Birth control/protection: None, Surgical    Comment: TVH  Lifestyle  . Physical activity:    Days per week: Not on file    Minutes per session: Not on file  . Stress: Not on file  Relationships  . Social connections:    Talks on phone: Not on file    Gets together: Not on file    Attends religious service: Not on file    Active member of club or organization: Not on file    Attends meetings of clubs or organizations: Not on file    Relationship status: Not on file  . Intimate partner violence:    Fear of current or ex partner: Not on file    Emotionally abused: Not on file    Physically abused: Not on file    Forced sexual activity: Not on file  Other Topics Concern  . Not on file  Social History Narrative  . Not on file    Review of Systems   Constitutional: Negative.   HENT: Negative.   Eyes: Negative.   Respiratory: Negative.   Cardiovascular: Negative.   Gastrointestinal: Negative.   Endocrine: Negative.   Genitourinary: Negative.   Musculoskeletal: Negative.   Skin: Negative.   Allergic/Immunologic: Negative.   Neurological: Negative.   Hematological: Negative.   Psychiatric/Behavioral: Negative.   All other systems reviewed and are negative.   PHYSICAL EXAMINATION:    BP 136/72 (BP Location: Left Arm, Patient Position: Sitting, Cuff Size: Normal)   Pulse 76   Resp 16   Ht 5\' 2"  (1.575 m)   Wt 169 lb (76.7 kg)   LMP 01/13/2000 (Approximate)   BMI 30.91 kg/m     General appearance: alert, cooperative and appears stated age   Pelvic: External genitalia:  no lesions              Urethra:  normal appearing urethra with no masses, tenderness or lesions              Bartholins and Skenes: normal                 Vagina: normal appearing vagina with normal color and discharge, no lesions. 3 mm firmness of left vaginal cuff, nontender.               Cervix:  absent Bimanual Exam:  Uterus:   absent              Adnexa: no mass, fullness, tenderness              Chaperone was present for exam.  ASSESSMENT  Status post hysterectomy.  Status post BSO for dyspareunia.  Persistent dyspareunia. Candida albicans and glabrata.  Status post Diflucan.   PLAN  Repeat Affirm done.  If Standley Dakins persists, will need boric acid vaginal suppositories.  [facility] if pain persists.    An After Visit Summary was printed and given to the patient.  _15_____ minutes face to face time of which over 50% was spent in counseling.

## 2017-12-23 ENCOUNTER — Ambulatory Visit: Payer: 59 | Admitting: Obstetrics and Gynecology

## 2017-12-23 ENCOUNTER — Other Ambulatory Visit: Payer: Self-pay

## 2017-12-23 ENCOUNTER — Encounter: Payer: Self-pay | Admitting: Obstetrics and Gynecology

## 2017-12-23 VITALS — BP 136/72 | HR 76 | Resp 16 | Ht 62.0 in | Wt 169.0 lb

## 2017-12-23 DIAGNOSIS — N760 Acute vaginitis: Secondary | ICD-10-CM

## 2017-12-24 LAB — VAGINITIS/VAGINOSIS, DNA PROBE
CANDIDA SPECIES: POSITIVE — AB
Gardnerella vaginalis: NEGATIVE
Trichomonas vaginosis: NEGATIVE

## 2017-12-25 ENCOUNTER — Other Ambulatory Visit: Payer: Self-pay | Admitting: *Deleted

## 2017-12-25 MED ORDER — NONFORMULARY OR COMPOUNDED ITEM: 0 refills | Status: DC

## 2017-12-25 MED ORDER — FLUCONAZOLE 150 MG PO TABS: 150.0000 mg | ORAL_TABLET | Freq: Once | ORAL | 0 refills | Status: AC

## 2018-03-10 ENCOUNTER — Other Ambulatory Visit: Payer: Self-pay | Admitting: Obstetrics and Gynecology

## 2018-03-10 DIAGNOSIS — Z1231 Encounter for screening mammogram for malignant neoplasm of breast: Secondary | ICD-10-CM

## 2018-04-21 ENCOUNTER — Ambulatory Visit
Admission: RE | Admit: 2018-04-21 | Discharge: 2018-04-21 | Disposition: A | Discharge status: Home / Self Care | Payer: 59 | Source: Ambulatory Visit | Attending: Obstetrics and Gynecology | Admitting: Obstetrics and Gynecology

## 2018-04-21 DIAGNOSIS — Z1231 Encounter for screening mammogram for malignant neoplasm of breast: Secondary | ICD-10-CM

## 2018-09-10 ENCOUNTER — Ambulatory Visit: Payer: 59 | Admitting: Obstetrics and Gynecology

## 2018-11-07 ENCOUNTER — Other Ambulatory Visit: Payer: Self-pay

## 2018-11-11 ENCOUNTER — Ambulatory Visit: Payer: 59 | Admitting: Obstetrics and Gynecology

## 2018-11-11 ENCOUNTER — Encounter: Payer: Self-pay | Admitting: Obstetrics and Gynecology

## 2018-11-11 ENCOUNTER — Other Ambulatory Visit: Payer: Self-pay

## 2018-11-11 VITALS — BP 128/72 | HR 80 | Temp 97.9°F | Resp 12 | Ht 62.0 in | Wt 170.0 lb

## 2018-11-11 DIAGNOSIS — Z23 Encounter for immunization: Secondary | ICD-10-CM

## 2018-11-11 DIAGNOSIS — Z01419 Encounter for gynecological examination (general) (routine) without abnormal findings: Secondary | ICD-10-CM

## 2018-11-11 MED ORDER — VALACYCLOVIR HCL 1 G PO TABS: 1000.0000 mg | ORAL_TABLET | Freq: Every day | ORAL | 3 refills | Status: AC

## 2018-11-11 NOTE — Patient Instructions (Signed)

## 2018-11-11 NOTE — Progress Notes (Signed)
63 y.o. G11P0003 Divorced [place] American female here for annual exam.    Partner with prostate cancer.  Not sexually active and comfortable not being sexually active.  No bladder concerns.  No urinary incontinence.  Some constipation if she does not drink enough water.  No vaginal itching or burning.   Staying quiet at home during pandemic.  Maintaining her yard and garden.   PCP:  Kathyrn Lass, MD   Patient's last menstrual period was 01/13/2000 (approximate).           Sexually active: No.  The current method of family planning is tubal ligation and status post hysterectomy.    Exercising: No.  The patient does not participate in regular exercise at present. Smoker:  no  Health Maintenance: Pap:   08-22-15 Neg:Neg HR HPV History of abnormal Pap:  no MMG:  04/21/18 BIRADS 1 negative/density b Colonoscopy:  03-13-10 normal;next due 02/2020 BMD:   04/16/16  Result  Normal TDaP:  2010 -- would like today HIV and Hep C: 08/22/15 Negative Screening Labs:  PCP   reports that she has never smoked. She has never used smokeless tobacco. She reports current alcohol use of about 1.0 standard drinks of alcohol per week. She reports that she does not use drugs.  Past Medical History:  Diagnosis Date  . Anemia   . Elevated hemoglobin A1c   . Enlarged uterus    c/w fibroids  . HSV-1 (herpes simplex virus 1) infection   . HSV-2 (herpes simplex virus 2) infection     Past Surgical History:  Procedure Laterality Date  . ABDOMINAL SURGERY  07/2016   liposuction  . FOOT SURGERY  1996   bilateral nerve injection  . laparoscopic BSO  09/13/08    lysis of adhesions-done secondary dysparenia post TVH-tubal dysplasia/CIS  . PELVIC LAPAROSCOPY  09-13-08   BSO/lysis of adhesions secondary to dyspareunia  . TUBAL LIGATION  1980  . VAGINAL HYSTERECTOMY  9/01    Current Outpatient Medications  Medication Sig Dispense Refill  . clindamycin (CLINDAGEL) 1 % gel APPLY TO AFFECTED AREA EVERY DAY     . clobetasol (TEMOVATE) 0.05 % external solution Apply 1 application topically daily.    . Esterified Estrogens (MENEST) 0.3 MG tablet Take one tablet (0.3 mg) by mouth daily. 90 tablet 0  . valACYclovir (VALTREX) 1000 MG tablet Take 1 tablet (1,000 mg total) by mouth daily. 90 tablet 3  . conjugated estrogens (PREMARIN) vaginal cream Use 1/2 g vaginally every night at bed time for the first 2 weeks, then use 1/2 g vaginally two or three times per week. (Patient not taking: Reported on 11/11/2018) 30 g 2   No current facility-administered medications for this visit.     Family History  Problem Relation Age of Onset  . Bladder Cancer Mother   . Colon cancer Brother 73  . Other Sister        x2 Behcet's syndrome  . Other Sister        Behcet's Syndrome    Review of Systems  Constitutional: Negative.   HENT: Negative.   Eyes: Negative.   Respiratory: Negative.   Cardiovascular: Negative.   Gastrointestinal: Negative.   Endocrine: Negative.   Genitourinary: Negative.   Musculoskeletal: Negative.   Skin: Negative.   Allergic/Immunologic: Negative.   Neurological: Negative.   Hematological: Negative.   Psychiatric/Behavioral: Negative.     Exam:   BP 128/72 (BP Location: Left Arm, Patient Position: Sitting, Cuff Size: Normal)   Pulse 80  Temp 97.9 F (36.6 C) (Temporal)   Resp 12   Ht 5\' 2"  (1.575 m)   Wt 170 lb (77.1 kg)   LMP 01/13/2000 (Approximate)   BMI 31.09 kg/m     General appearance: alert, cooperative and appears stated age Head: normocephalic, without obvious abnormality, atraumatic Neck: no adenopathy, supple, symmetrical, trachea midline and thyroid normal to inspection and palpation Lungs: clear to auscultation bilaterally Breasts: normal appearance, no masses or tenderness, No nipple retraction or dimpling, No nipple discharge or bleeding, No axillary adenopathy Heart: regular rate and rhythm Abdomen: soft, non-tender; no masses, no  organomegaly Extremities: extremities normal, atraumatic, no cyanosis or edema Skin: skin color, texture, turgor normal. No rashes or lesions Lymph nodes: cervical, supraclavicular, and axillary nodes normal. Neurologic: grossly normal  Pelvic: External genitalia:  no lesions              No abnormal inguinal nodes palpated.              Urethra:  normal appearing urethra with no masses, tenderness or lesions              Bartholins and Skenes: normal                 Vagina: normal appearing vagina with normal color and discharge, no lesions              Cervix: absent              Pap taken: No. Bimanual Exam:  Uterus:   absent              Adnexa: no mass, fullness, tenderness              Rectal exam: Yes.  .  Confirms.              Anus:  normal sphincter tone, no lesions  Chaperone was present for exam.  Assessment:   Well woman visit with normal exam. Status post TVH. 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 Dyspareunia. Chronic. Normal pelvic ultrasound. Atrophy suspected.  Could have adhesive disease.  Second degree cystocele.  First degree rectocele. Hx HSV 1 and 2.   Plan: Mammogram screening discussed. Self breast awareness reviewed. Pap and HR HPV as above. Guidelines for Calcium, Vitamin D, regular exercise program including cardiovascular and weight bearing exercise. TDap today.  Refill of Valtrex 1000 mg daily for prevention.  No Rx for vaginal estrogen cream.  Labs with PCP. Follow up annually and prn.   After visit summary provided.

## 2019-03-05 ENCOUNTER — Telehealth: Payer: Self-pay | Admitting: Obstetrics and Gynecology

## 2019-03-05 NOTE — Telephone Encounter (Signed)
Spoke with patient. Patient calling to schedule colonoscopy. Per review of 11/11/18 AEX notes, last colonoscopy 03/13/10. Per review of Epic, colonoscopy with Dr. Delfin Edis at [facility]. Advised patient Dr. Olevia Perches has since retried. Patient will contact [facility] to schedule colonoscopy with another provider, is aware to notify office if a referral is needed.   [facility]  [place], [place] [address]   Phone: [phone]  Fax: [phone]    Routing to provider for final review. Patient is agreeable to disposition. Will close encounter.

## 2019-03-05 NOTE — Telephone Encounter (Signed)
Patient is calling regarding scheduling next colonoscopy in January.

## 2019-03-23 ENCOUNTER — Other Ambulatory Visit: Payer: Self-pay | Admitting: Obstetrics and Gynecology

## 2019-03-23 DIAGNOSIS — Z1231 Encounter for screening mammogram for malignant neoplasm of breast: Secondary | ICD-10-CM

## 2019-05-13 ENCOUNTER — Ambulatory Visit
Admission: RE | Admit: 2019-05-13 | Discharge: 2019-05-13 | Disposition: A | Discharge status: Home / Self Care | Payer: 59 | Source: Ambulatory Visit | Attending: Obstetrics and Gynecology | Admitting: Obstetrics and Gynecology

## 2019-05-13 ENCOUNTER — Other Ambulatory Visit: Payer: Self-pay

## 2019-05-13 DIAGNOSIS — Z1231 Encounter for screening mammogram for malignant neoplasm of breast: Secondary | ICD-10-CM

## 2019-05-14 ENCOUNTER — Encounter

## 2019-05-14 ENCOUNTER — Ambulatory Visit: Payer: 59

## 2019-05-14 ENCOUNTER — Other Ambulatory Visit: Payer: Self-pay | Admitting: Obstetrics and Gynecology

## 2019-05-14 DIAGNOSIS — R928 Other abnormal and inconclusive findings on diagnostic imaging of breast: Secondary | ICD-10-CM

## 2019-07-08 ENCOUNTER — Other Ambulatory Visit: Payer: Self-pay

## 2019-07-08 ENCOUNTER — Ambulatory Visit
Admission: RE | Admit: 2019-07-08 | Discharge: 2019-07-08 | Disposition: A | Discharge status: Home / Self Care | Payer: 59 | Source: Ambulatory Visit | Attending: Obstetrics and Gynecology | Admitting: Obstetrics and Gynecology

## 2019-07-08 DIAGNOSIS — R928 Other abnormal and inconclusive findings on diagnostic imaging of breast: Secondary | ICD-10-CM

## 2019-07-14 ENCOUNTER — Other Ambulatory Visit: Payer: 59

## 2019-12-09 ENCOUNTER — Ambulatory Visit: Payer: 59 | Admitting: Obstetrics and Gynecology

## 2020-04-12 IMAGING — MG MM DIGITAL DIAGNOSTIC UNILAT*L* W/ TOMO W/ CAD
6 series · 6 of 18 positions shown · non-contrast
Comparison: Previous exam(s).

CLINICAL DATA: Screening recall for a possible left breast mass.

EXAM:
DIGITAL DIAGNOSTIC LEFT MAMMOGRAM WITH CAD AND TOMO
ULTRASOUND LEFT BREAST

[L CC synth-2D (1 of 2)]
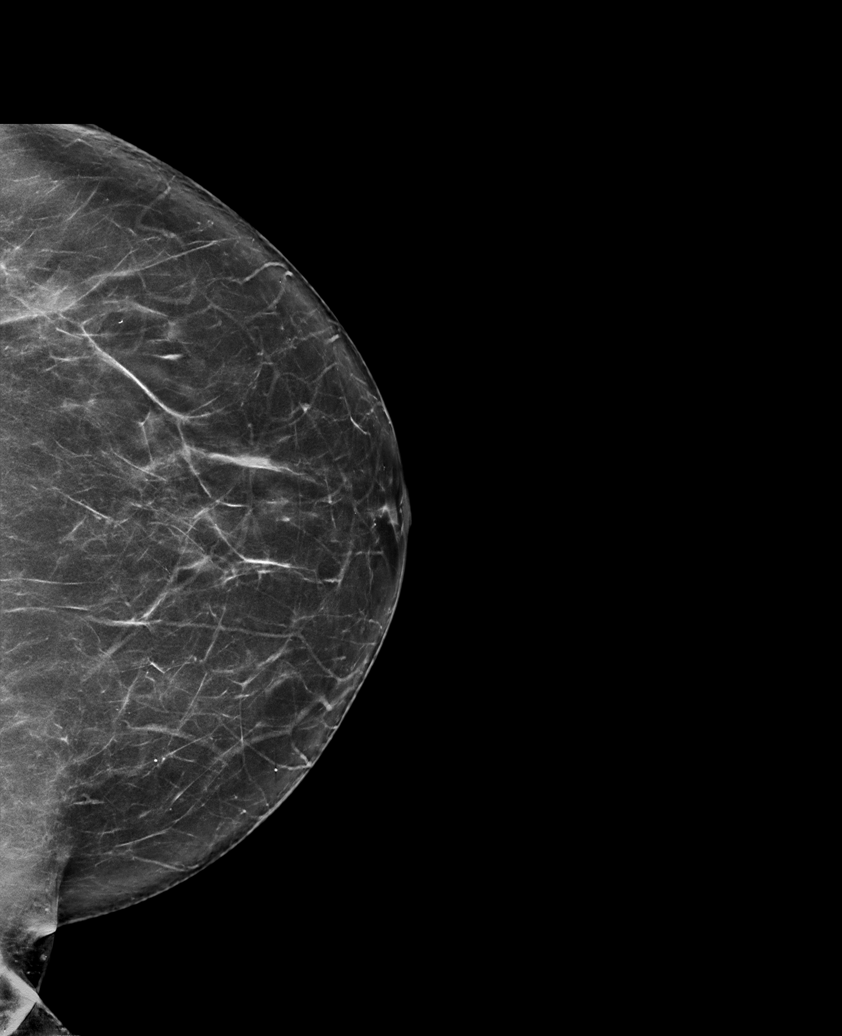

[L CC synth-2D (2 of 2)]
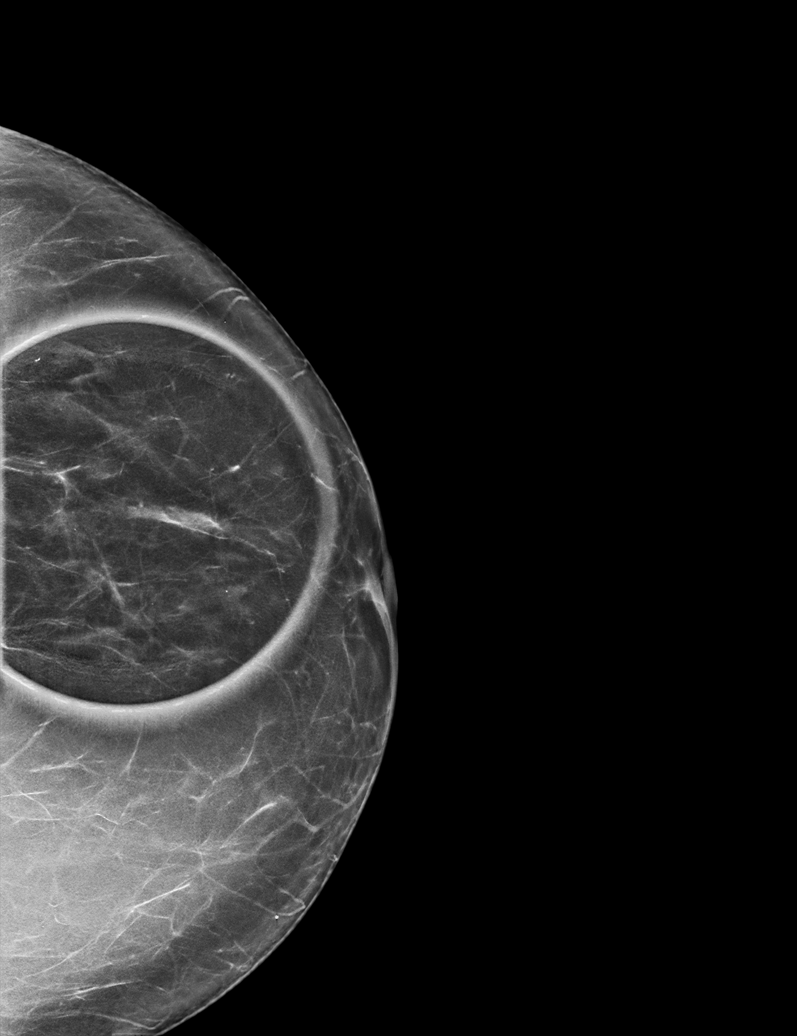

[L MLO synth-2D]
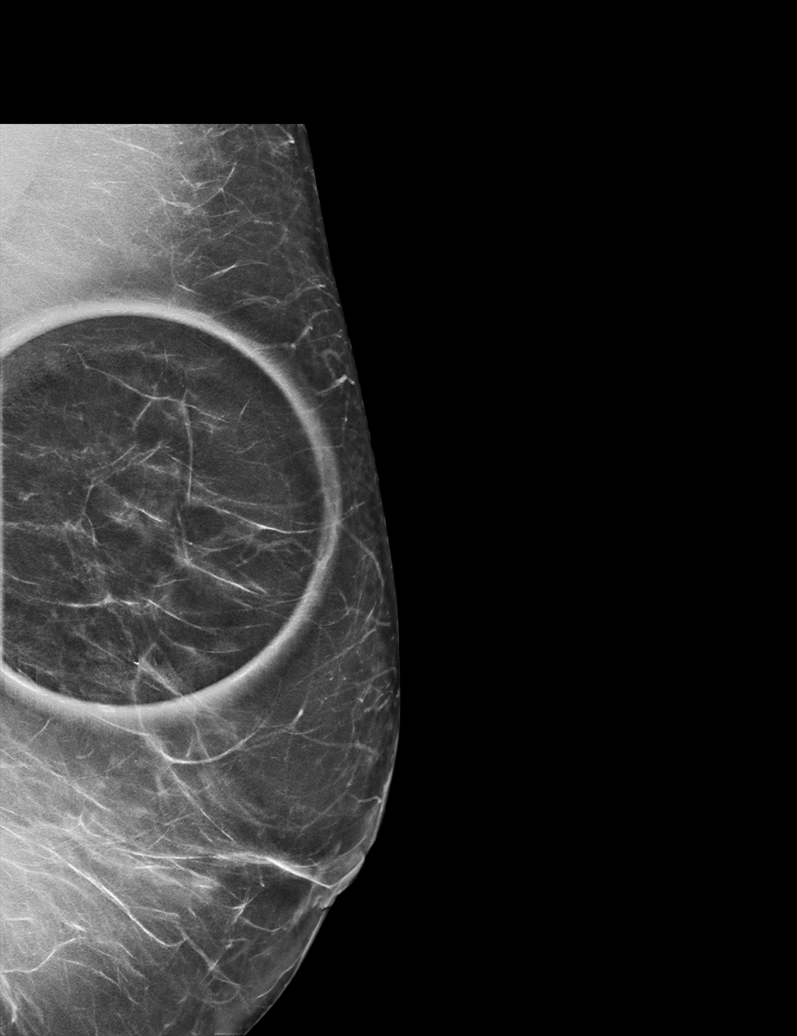

[L MLO tomo · tomo slice 37/72.0]
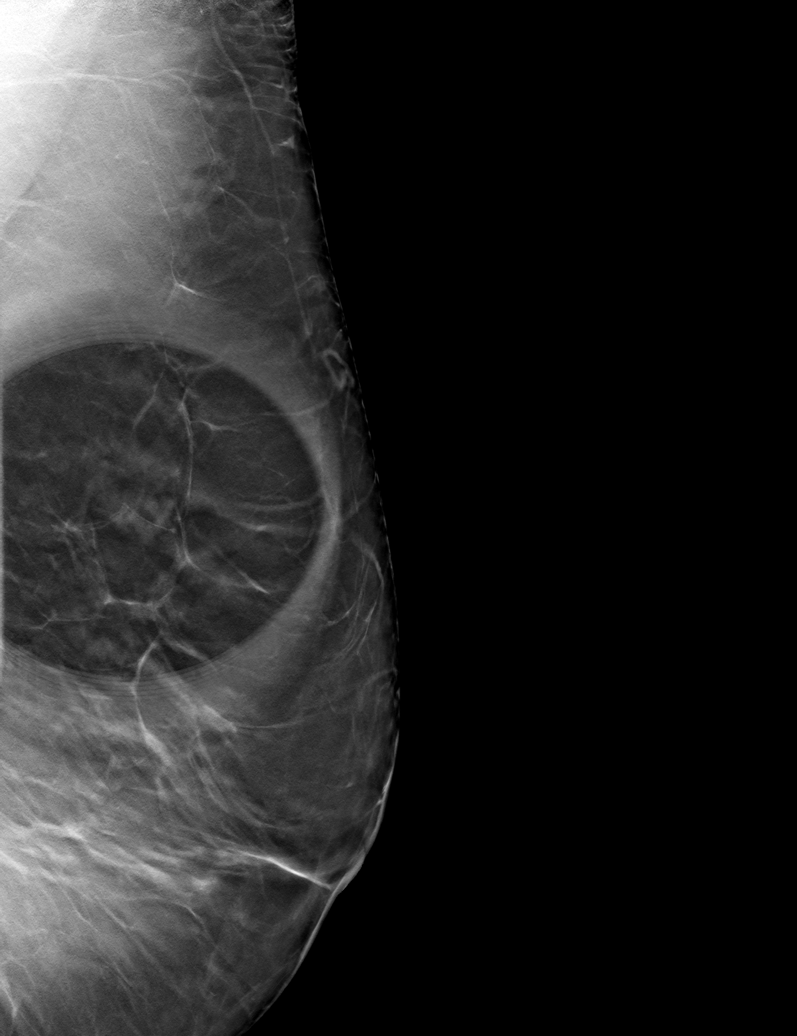

[L CC tomo (1 of 2) · tomo slice 31/62.0]
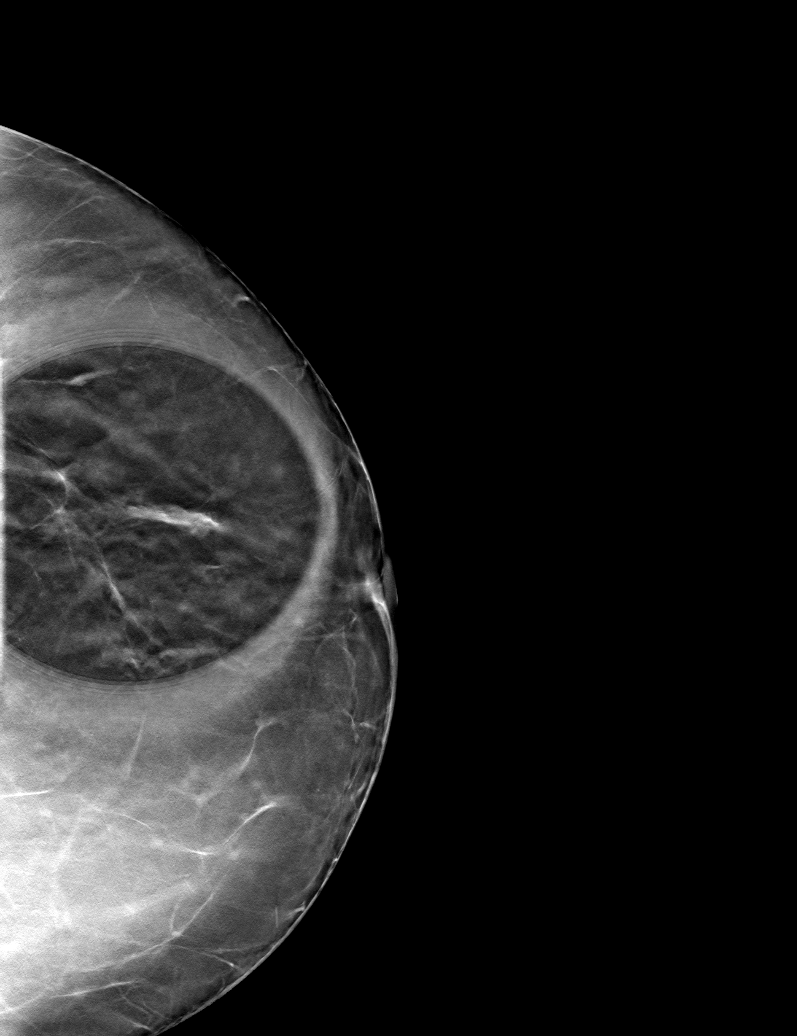

[L CC tomo (2 of 2) · tomo slice 40/79.0]
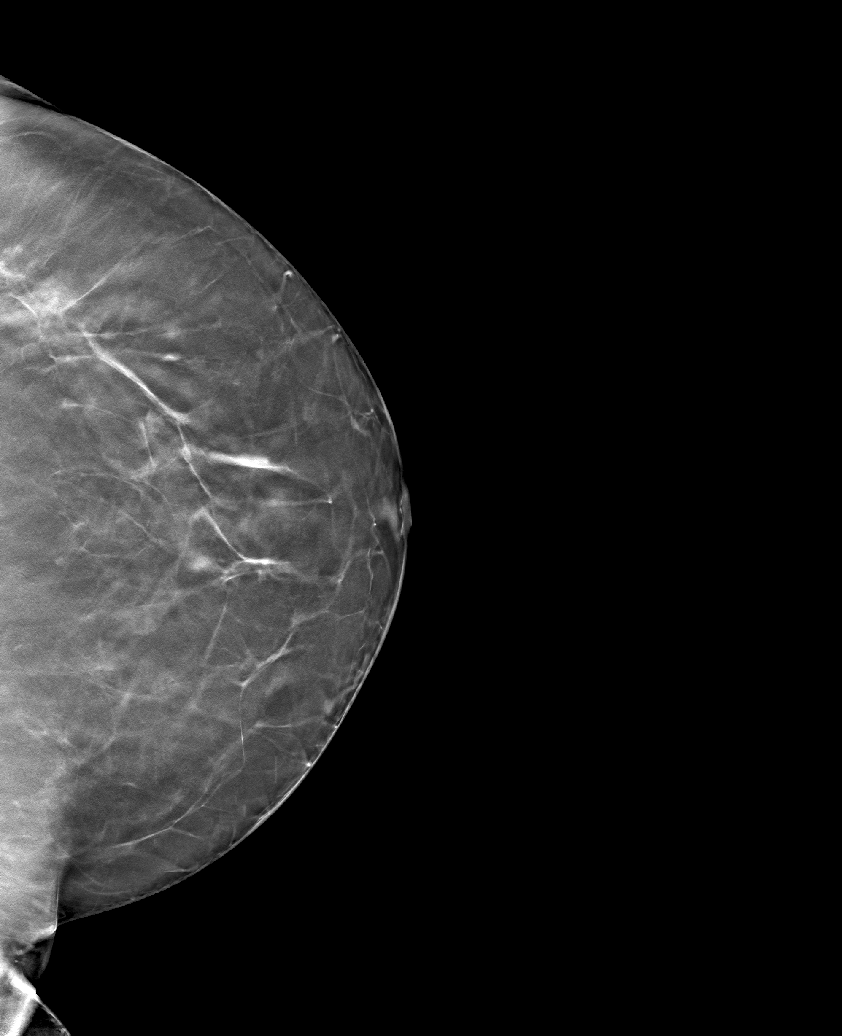

[6 of 18 positions shown; findings below may reference images not displayed]

ACR Breast Density Category b: There are scattered areas of
fibroglandular density.
FINDINGS: Spot compression tomosynthesis images through the upper slightly
outer left breast demonstrates a persistent irregular mass, though
it appears less prominent/smaller than on the screening mammogram
from [place].

Mammographic images were processed with CAD.

Ultrasound of the left breast at 1 o'clock, 6 cm from the nipple
demonstrates a cluster of anechoic masses together spanning 0.9 x
0.4 x 0.7 cm.
IMPRESSION: The mass in the upper-outer quadrant of the left breast corresponds
with benign fibrocystic change.

RECOMMENDATION:
Screening mammogram in one year.(Code:Y4-K-243)

I have discussed the findings and recommendations with the patient.
If applicable, a reminder letter will be sent to the patient
regarding the next appointment.

BI-RADS CATEGORY  2: Benign.
